# Patient Record
Sex: Female | Born: 1940 | Race: White
Health system: Northeastern US, Academic
[De-identification: ages and names within clinical notes are randomized; demographics above are authoritative.]

## PROBLEM LIST (undated history)

## (undated) DIAGNOSIS — M779 Enthesopathy, unspecified: Secondary | ICD-10-CM

## (undated) DIAGNOSIS — S83209A Unspecified tear of unspecified meniscus, current injury, unspecified knee, initial encounter: Secondary | ICD-10-CM

## (undated) DIAGNOSIS — C4491 Basal cell carcinoma of skin, unspecified: Secondary | ICD-10-CM

## (undated) DIAGNOSIS — M7542 Impingement syndrome of left shoulder: Secondary | ICD-10-CM

## (undated) DIAGNOSIS — I341 Nonrheumatic mitral (valve) prolapse: Secondary | ICD-10-CM

## (undated) DIAGNOSIS — S63056A Dislocation of other carpometacarpal joint of unspecified hand, initial encounter: Secondary | ICD-10-CM

## (undated) DIAGNOSIS — M719 Bursopathy, unspecified: Secondary | ICD-10-CM

## (undated) HISTORY — DX: Nonrheumatic mitral (valve) prolapse: I34.1

## (undated) HISTORY — DX: Bursopathy, unspecified: M71.9

## (undated) HISTORY — DX: Enthesopathy, unspecified: M77.9

## (undated) HISTORY — DX: Basal cell carcinoma of skin, unspecified: C44.91

## (undated) HISTORY — DX: Unspecified tear of unspecified meniscus, current injury, unspecified knee, initial encounter: S83.209A

## (undated) HISTORY — DX: Dislocation of other carpometacarpal joint of unspecified hand, initial encounter: S63.056A

## (undated) HISTORY — DX: Impingement syndrome of left shoulder: M75.42

---

## 2013-12-29 IMAGING — NM HEPATOBILIARY SCAN WITH SINCALIDE
10 series · 15 of 15 positions shown · non-contrast
Comparison: none

HEPATOBILIARY SCAN WITH CCK, 12/29/2013 [DATE]:
HISTORY: Postprandial pain, bloating
TECHNIQUE: The patient was injected with 8.2 mCi of technetium 99m Choletec.
Imaging of the right upper quadrant were then performed.  1.2 mcg of sincalide
was injected intravenously and a stimulated gallbladder ejection fraction was
performed.

[5 min · 2.26mm/px · 1 of 1 slices shown]
[im 1/1]
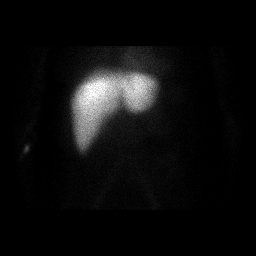

[10 min · 2.26mm/px · 1 of 1 slices shown]
[im 1/1]
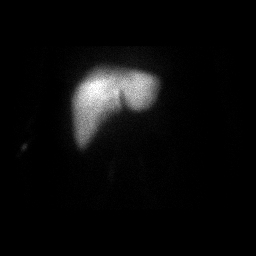

[20 min · 2.26mm/px · 1 of 1 slices shown]
[im 1/1]
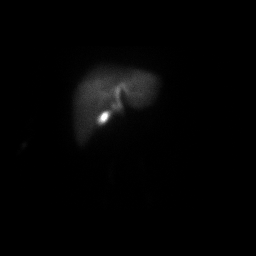

[30 min · 2.26mm/px · 1 of 1 slices shown]
[im 1/1]
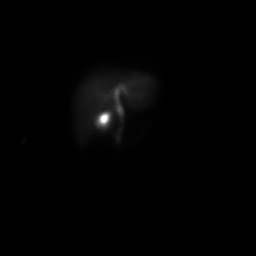

[rao · 2.26mm/px · 1 of 1 slices shown]
[im 1/1]
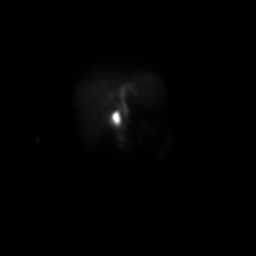

[lao · 2.26mm/px · 1 of 1 slices shown]
[im 1/1]
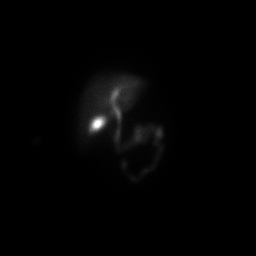

[40 min · 2.26mm/px · 1 of 1 slices shown]
[im 1/1]
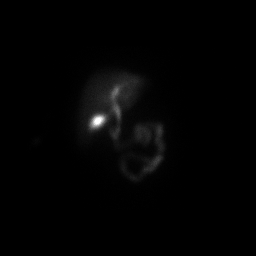

[50 min · 2.26mm/px · 1 of 1 slices shown]
[im 1/1]
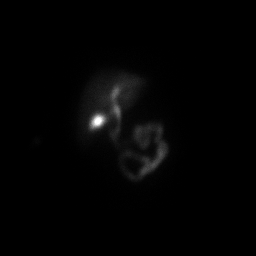

[60 min · 2.26mm/px · 1 of 1 slices shown]
[im 1/1]
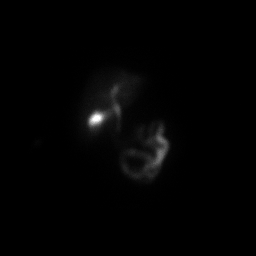

[gb ef · 4.52mm/px · 6 of 34 frames shown]
[frame 3/34]
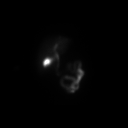
[frame 9/34]
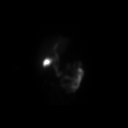
[frame 15/34]
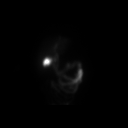
[frame 20/34]
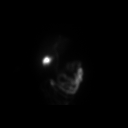
[frame 26/34]
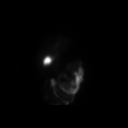
[frame 32/34]
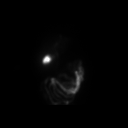

[15 of 15 positions shown; findings below may reference images not displayed]

FINDINGS: There is normal uptake and clearance of radiopharmaceutical by the
liver. The gallbladder is well visualized at 20 minutes, and common bile duct
and small bowel are well visualized by 30-35 minutes. Gallbladder ejection
fraction at 10 minutes is 0, 20 minutes 5%, 30 minutes 3%.
IMPRESSION: The cystic and common bile ducts are open. There is marked decrease in
gallbladder ejection fraction, which is compatible with gallbladder
dyskinesia,
possibly chronic cholecystitis.

## 2015-08-31 ENCOUNTER — Telehealth: Payer: Self-pay

## 2015-08-31 NOTE — Telephone Encounter (Signed)
Called patient and spoke with husband told him I needed to verify her insurance and he was on the other line with her so he would give her my number so she could call me and give me her insurance information.

## 2015-09-07 ENCOUNTER — Ambulatory Visit: Payer: Self-pay | Admitting: Dermatology

## 2015-09-07 ENCOUNTER — Encounter: Payer: Self-pay | Admitting: Dermatology

## 2015-09-07 VITALS — BP 126/76 | HR 89 | Ht 60.0 in | Wt 138.0 lb

## 2015-09-07 DIAGNOSIS — C44310 Basal cell carcinoma of skin of unspecified parts of face: Secondary | ICD-10-CM

## 2015-09-07 NOTE — Patient Instructions (Signed)
Full Thickness Skin Graft    A full thickness skin graft is a thin layer of skin placed over the surgical wound and stitched into place.  The new skin depends on the blood supply from the wound surface and the skin edges around it.  To help the new skin take it is protected with a yellow medicated gauze that is tied on over the graft and then it is covered with a pressure dressing.  The donor site, where the skin was borrowed from, will also have stitches and will be covered with a pressure dressing.    WOUND CARE:  The pressure dressing over the graft, nose must be kept on and dry until Saturday and then remove bandage and clean daily. Apply aquaphor and cover with telfa and tape or a band aid.    The dressing over the donor site, in front of ear needs to stay on and dry until Saturday. This site then needs to be cleaned daily with soap and water.  Pat Dry.  Apply a thin layer of Vaseline/aquaphor ointment over the stitches.  (If you have steri-strips over the site do not use any ointment) Cover the wound with a piece of Telfa non-stick dressing held in place with a band-aid or tape.    PAIN:  Postoperative pain is usually minimal.  Tylenol or Ibuprofen should relieve any pain you may have.  For the first day or two you may also use an icepack for twenty minutes every two to three hours.  For severe pain  not relieved with Tylenol/Ibuprofen please call the office.      BLEEDING:  Careful attention has been given to your wound to prevent bleeding.  The dressings you have on are pressure dressings to help prevent bleeding.    A small amount of blood on the dressing is normal.  If the bleeding seems persistent and saturates either dressing, apply firm, steady pressure over the dressing with fresh gauze for fifteen minutes.  Tape the gauze in place.  This is usually adequate treatment.  If the bleeding does not stop call our office.    Relax and recline the first day or two.  No bending/heavy lifting for one week.   Chew softer foods on your right side.       QUESTIONS/CONCERNS: If you have any questions or concerns please dont hesitate to call out office at [phone].  You may call the same number for after hour or weekend emergencies (severe pain, excessive bleeding/swelling) and the Dermatology resident on call will contact you.                                         WOUND CARE ONE WEEK FOLLOWING SURGERY      The bandages and sutures will be removed in the office.  The graft is normally dark pink/purple and crusty around the edges.  Usually within a month the graft lightens and starts to become flesh colored.     The graft should be cleaned daily with soap and water.  Pat dry. Apply a layer of Vaseline/aquaphor ointment around the edges and cover with a piece of Telfa non-stick dressing held in place with a band aid or tape.    The donor site will be covered with steri- strips.  Steri-strips are like little pieces of tape that reinforce the suture line.  Wash over the steri-strips with  soap and water and pat dry.  Do not remove the steri-strips , let them fall off on their own.

## 2015-09-08 ENCOUNTER — Telehealth: Payer: Self-pay

## 2015-09-08 NOTE — Telephone Encounter (Signed)
Called pt for 24 hour surgical post-op follow-up.      Bleeding: none    Discomfort: +pain, tylenol with good relief    Concerns: none    Pt verbalized understanding: yes    Follow-up: S/R

## 2015-09-08 NOTE — Progress Notes (Signed)
PREOPERATIVE DIAGNOSIS: Basal cell carcinoma  POSTOPERATIVE DIAGNOSIS: Same   OPERATION   Mohs Surgery    Indications:  The patient was referred by Dr. Suella Broad and has a biopsy proven basal cell carcinoma on the left ala measuring 5 x 4 mm.  Due to its location, Mohs surgery is indicated.  The Mohs surgical procedure was explained including other therapeutic options, and the inherent risks of bleeding, scar formation, reaction to local anesthesia, cosmetic deformity, recurrence, infection, and nerve damage.  Informed consent was obtained and the pt. underwent fresh tissue Mohs surgery as follows.     STAGE I: The site of the skin cancer was identified concurrently by both the patient and Dr. Owens Shark and marked with a surgical pen; the margins of the excision were delineated with the marking pen.  The patient was placed on the operating table.  The wound was defined and infiltrated with 1% Lidocaine with epinephrine 1:100,000.  All gross tumor was completely excised in a debulking stage using aggressive curettage and/or cold steel.  With all visible gross tumor completely excised, an excision was made around the debulking defect.  Hemostasis was obtained by spot electrodesiccation.  A pressure dressing was placed.  Tissue was divided into two tissue blocks which were mapped, color coded at their margins, and sent to the technician for frozen sectioning.  Microscopic tumor was found persisting in none of the tissue blocks.  Following surgery the defect measured as follows: 10 x 7 mm to the subcutaneous tissue and dermis.  Closure will be by a full thickness skin graft.    CONDITION AT TERMINATION OF THERAPY: Carcinoma removed.    Complications:  None    PREOPERATIVE DIAGNOSIS:  Defect following Mohs micrographic surgery for a basal cell carcinoma  POSTOPERATIVE DIAGNOSIS: Basal cell carcinoma  OPERATION: Full thickness skin graft    INDICATIONS: The patient was left with a defect measuring 10 x 7 mm located on the  left ala following Mohs micrographic surgery for removal of a basal cell carcinoma.  Various closure modalities were discussed with the patient and it was decided that a Full thickness skin graft would best preserve normal anatomical and functional relationships.  The procedure was explained, including the risks of bleeding scarring, infection, wound dehiscence, numbness or other nerve damage, flap/graft necrosis and reaction to local anesthesia, informed consent was obtained and the patient underwent the procedure as follows.    Full thickness skin graft    Procedure:  The patient was taken to the operative suite and positioned comfortably on the operating room table.  1% Lidocaine with Epinephrine was used for local anesthesia at both the defect site as well as the donor site on the left preauricular area.  The areas were washed with Hibiclens rinsed with saline and draped with sterile towels. A template of the defect was taken using a non-stick Telfa and was transferred to the donor site.  The skin graft was sharply excised along the template to the level of the dermal/subcutaneous interface.  The graft was placed in sterile saline.  The donor defect was undermined in a subcutaneous plane.  Bleeding was controlled with spot electrodesiccation.  Deep closure was achieved with 5-0 Monocryl sutures.  Excess cones of tissue were removed with the triangulation technique.  The epidermis was closed with 6-0 Prolene sutures.  The final wound size was 2 cm.  The graft was aggressively defatted.  It was trimmed to fit exactly into the defect and then was secured around  the periphery with 6-0 Prolene sutures.  Final graft size: 1 cm squared.   Sterile pressure dressings were applied at both sites.  Wound care instructions were given.  The patient was discharged ambulatory and with stable vital signs.  RV in one week.    Final Procedure: Full thickness skin graft.     Complications:  None.      Learning needs assessed.    Satira Anis, am scribing for Dr. Delynn Flavin.

## 2015-09-14 ENCOUNTER — Encounter: Payer: Self-pay | Admitting: Dermatology

## 2015-09-14 ENCOUNTER — Ambulatory Visit: Payer: Self-pay | Admitting: Dermatology

## 2015-09-14 VITALS — BP 130/67 | HR 84 | Ht 60.0 in | Wt 138.0 lb

## 2015-09-14 DIAGNOSIS — Z4802 Encounter for removal of sutures: Secondary | ICD-10-CM

## 2015-09-14 NOTE — Progress Notes (Signed)
Post-op Check    No pain or bleeding. Surgical site L ala  healing well.  No infection, hematoma or dehiscence.  There is good take of the graft with pink/purple color.  Donor site is healing well.    Sutures removed, steristrips applied.  Wound care instructions reinforced.    Follow-up in 3-4 weeks unless patient pleased with healing at that time.  Long-term follow-up with primary dermatologist for skin checks.

## 2015-09-14 NOTE — Patient Instructions (Signed)
WOUND CARE ONE WEEK FOLLOWING SURGERY      The bandages and sutures will be removed in the office.  The graft is normally dark pink/purple and crusty around the edges.  Usually within a month the graft lightens and starts to become flesh colored.     The graft should be cleaned daily with soap and water.  Pat dry. Apply a layer of Vaseline/aquaphor ointment around the edges and cover with a piece of Telfa non-stick dressing held in place with a band aid or tape.    The donor site will be covered with steri- strips.  Steri-strips are like little pieces of tape that reinforce the suture line.  Wash over the steri-strips with soap and water and pat dry.  Do not remove the steri-strips , let them fall off on their own.

## 2020-01-20 IMAGING — DX HIP 2 VIEWS RIGHT WITH PELVIS
1 series · 3 of 3 positions shown · non-contrast
Comparison: 11/20/2016 CT

HIP 2 VIEWS RIGHT WITH PELVIS, 01/20/2020 [DATE]: 
CLINICAL INDICATION:  Chronic right hip pain.

[Series 1: AP · U · 0.14mm/px · 3 of 3 slices shown]
[im 1/3]
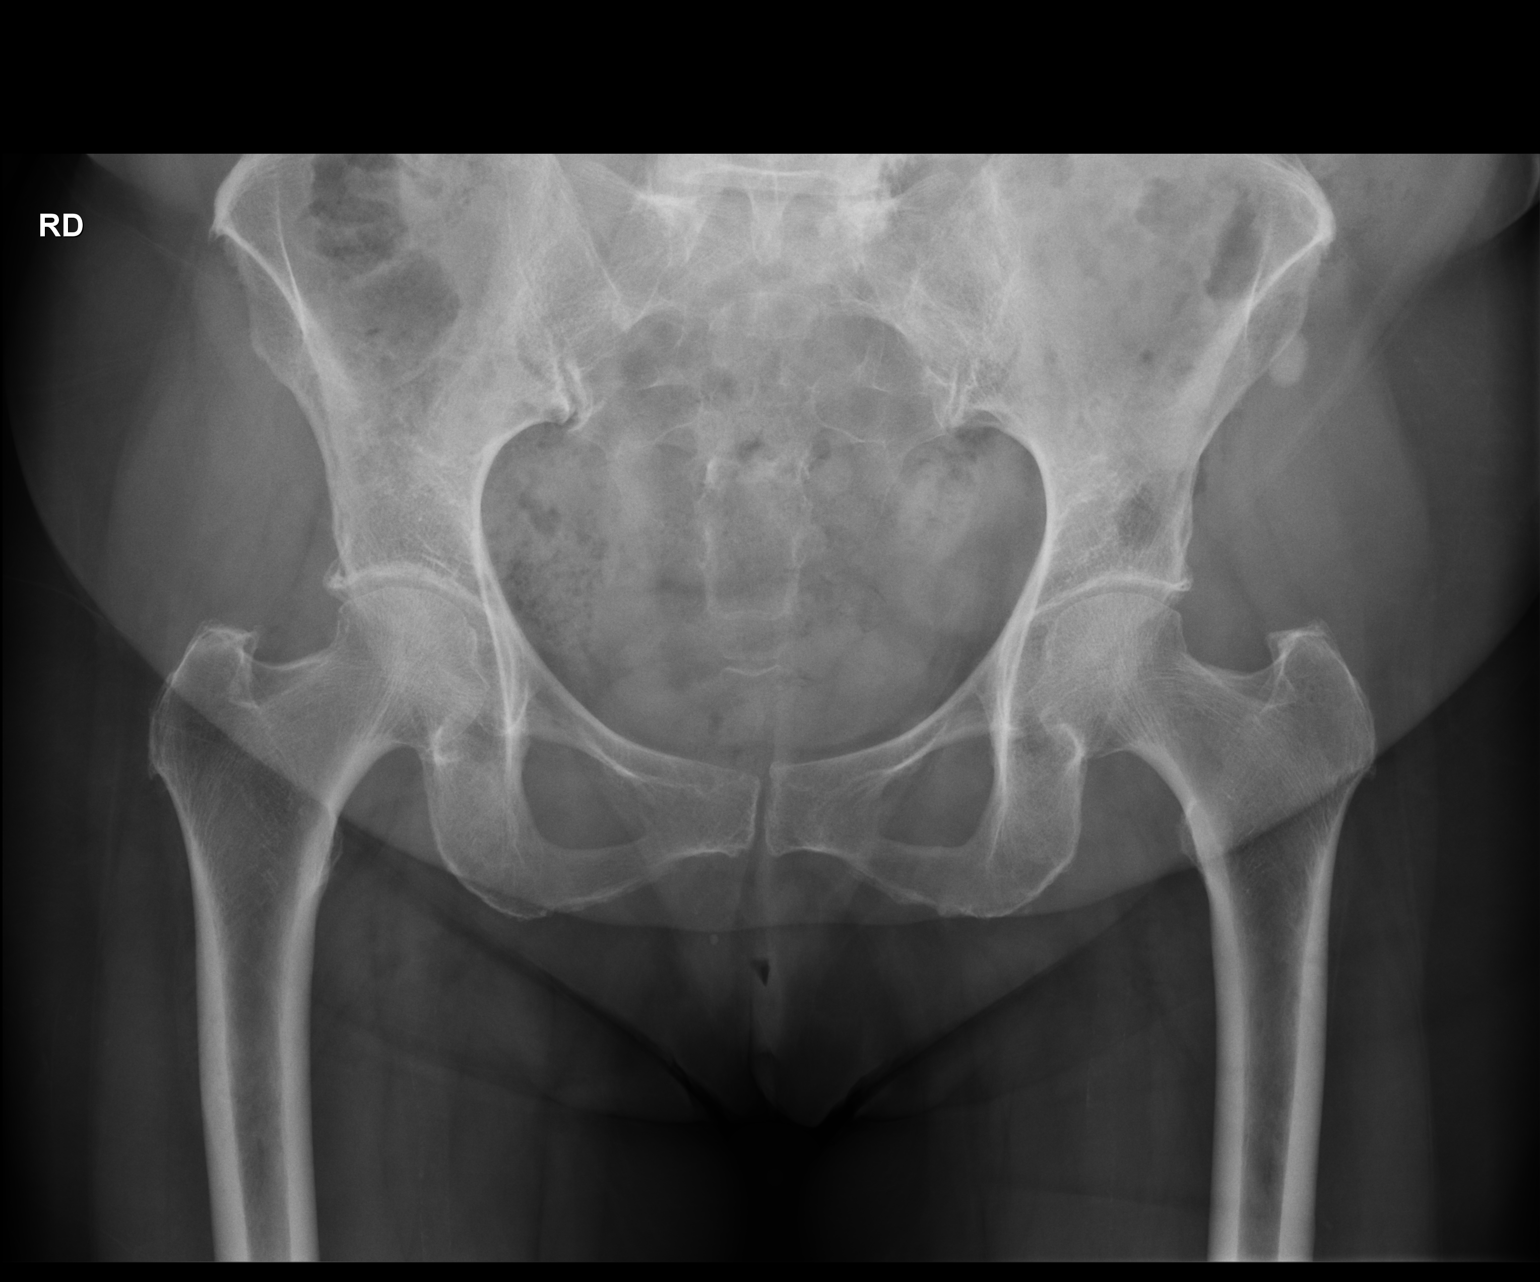
[im 2/3]
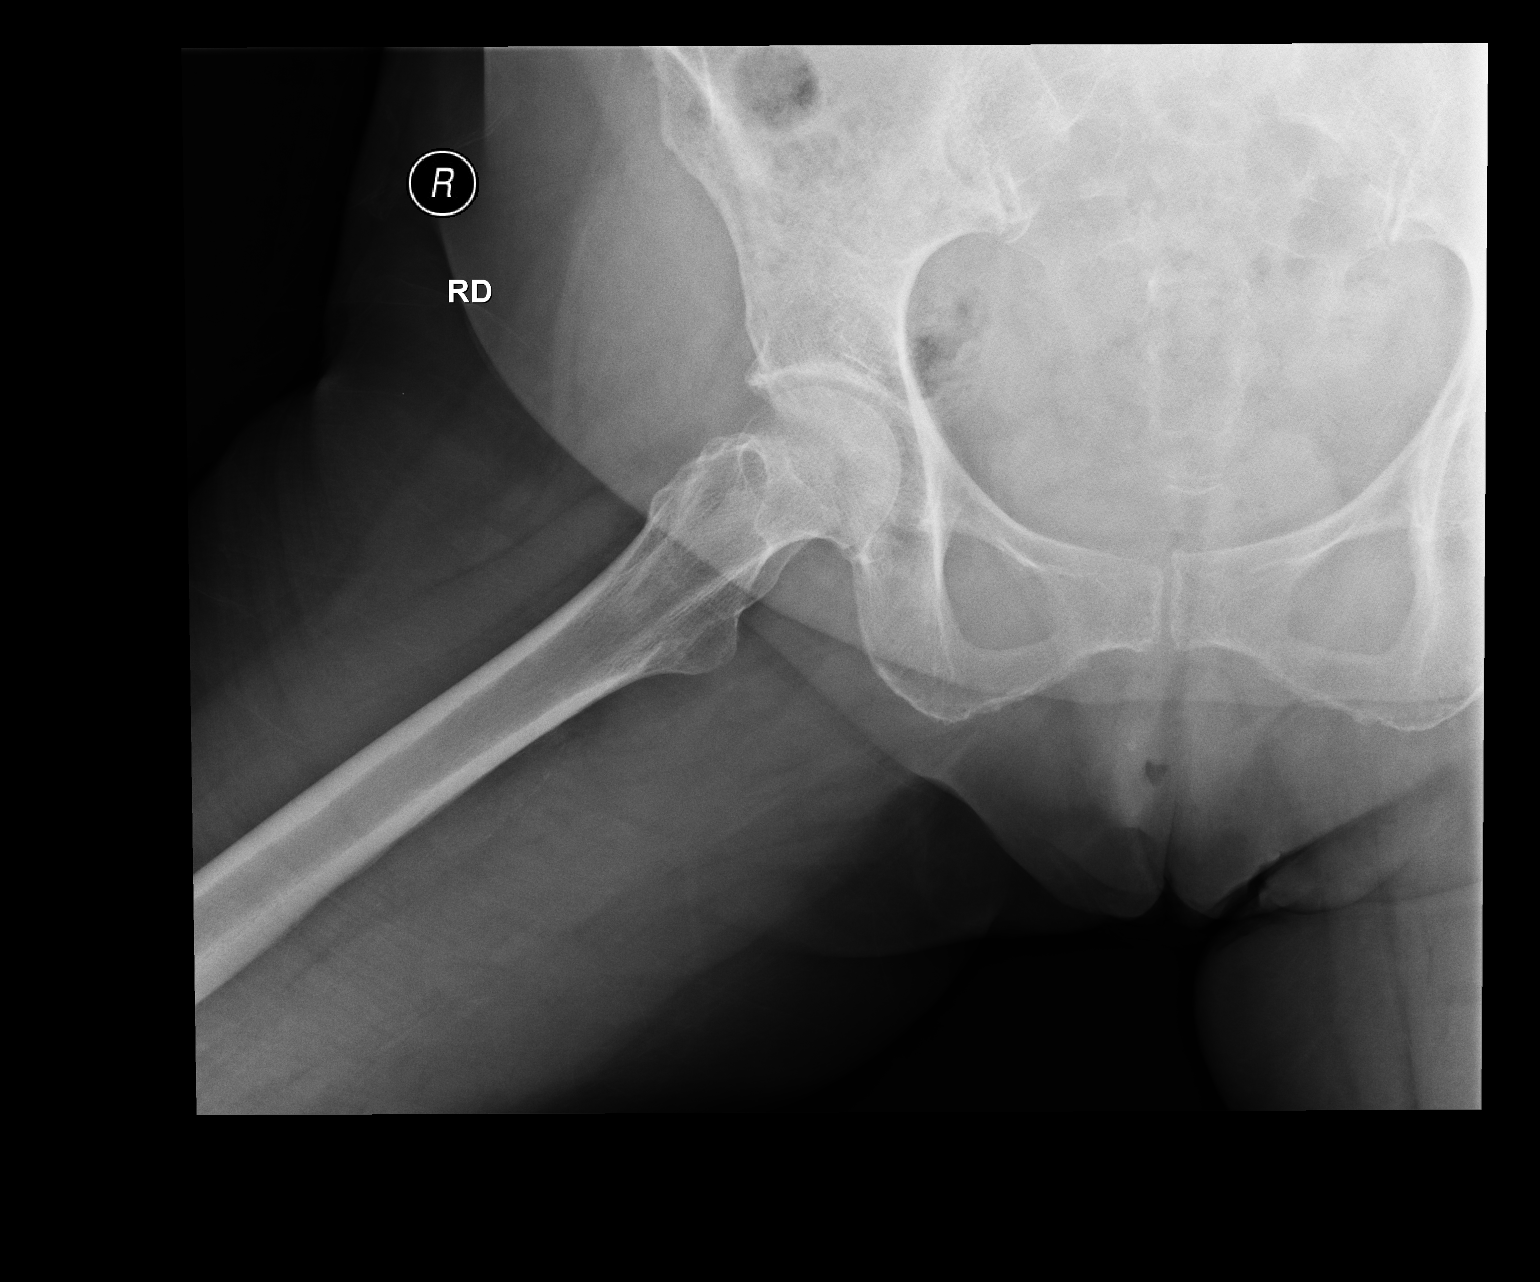
[im 3/3]
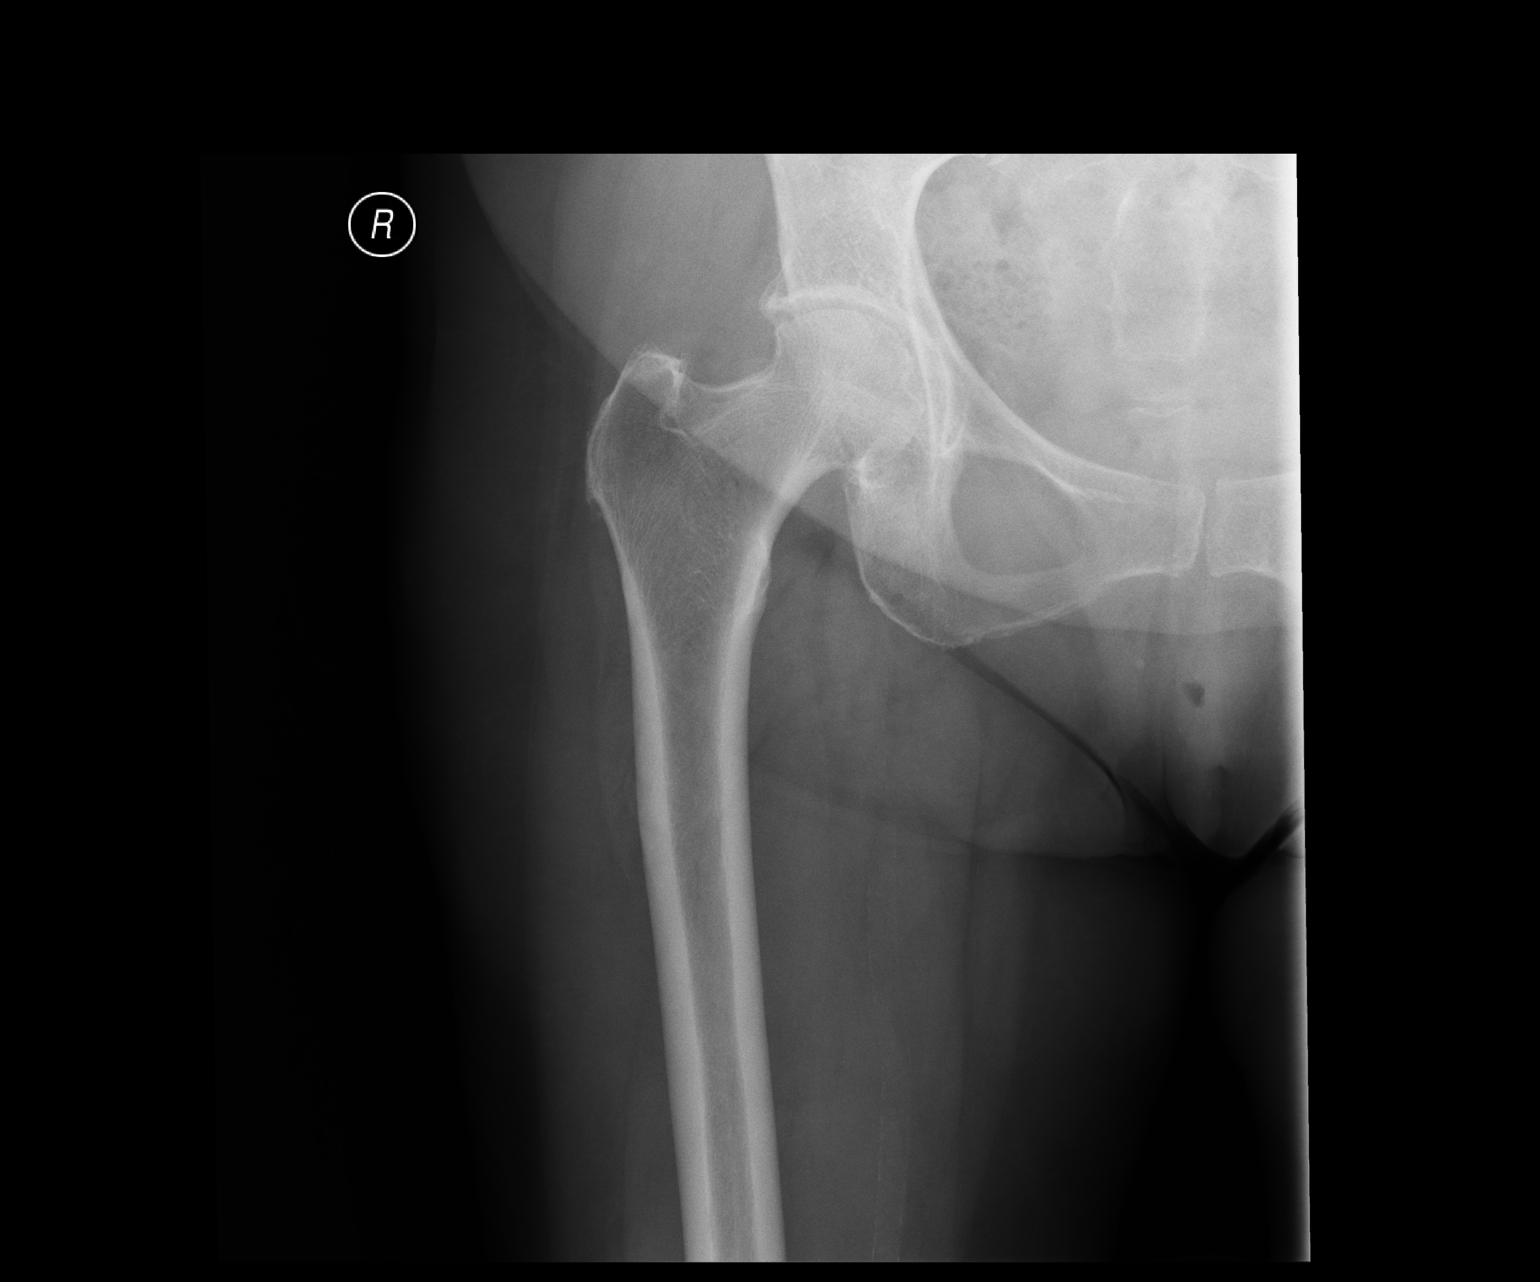

[3 of 3 positions shown; findings below may reference images not displayed]

FINDINGS: Mild degenerative change of the hips and pubic symphysis. Degenerative 
change of the SI joints and spine. The femoral heads are well located. No 
fracture. Normal alignment. Osteopenia. Soft tissues are negative.
IMPRESSION: 1. Mild degenerative change of the hips and pubic symphysis. 
2. Degenerative change of the SI joints and spine. 
3. Osteopenia: DXA may be helpful for further evaluation.

## 2020-02-06 IMAGING — MR MRI LUMBAR SPINE WITHOUT CONTRAST
4 of 8 series · 16 of 48 positions shown · non-contrast
Comparison: None

MRI LUMBAR SPINE WITHOUT CONTRAST, 02/06/2020 [DATE]: 
CLINICAL INDICATION: Low back pain, right groin pain for one year, sterile 
bronchus 6 months ago with increasing low back pain, no trauma, no prior lumbar 
surgery
TECHNIQUE: Multiplanar multisequence MR of the lumbar spine without contrast.

[Series 101: survey · axial · 10.0mm · 1.39mm/px · z∈[-33,+201]mm · 2 of 10 slices shown]
[im 1/10]
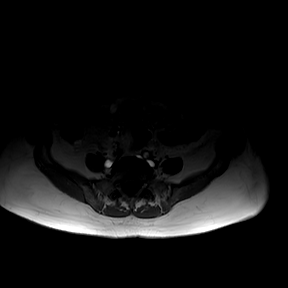
[im 10/10]
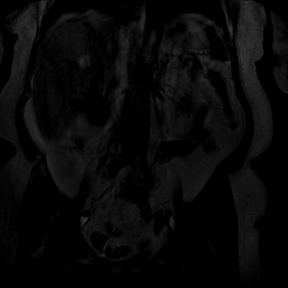

[Series 201: t2w_cor-surv · coronal · 6.0mm · 0.58mm/px · 1 of 7 slices shown]
[im 1/7]
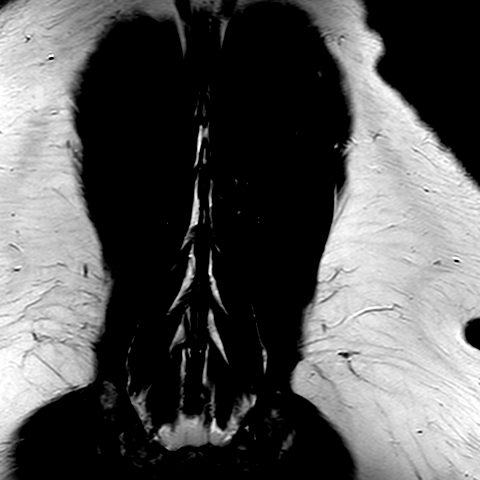

[Series 301: t2_tse_sag · sagittal · 4.0mm · 0.41mm/px · 5 of 17 slices shown]
[im 1/17]
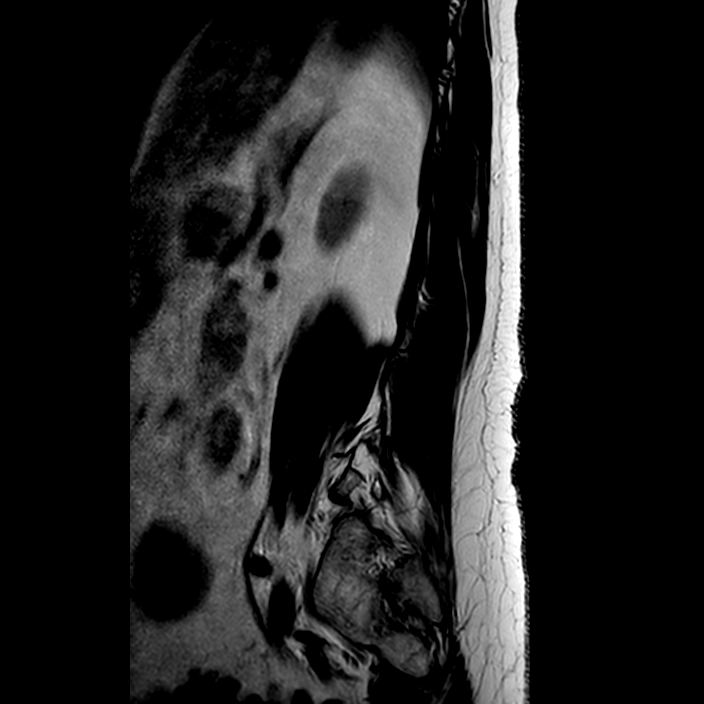
[im 5/17]
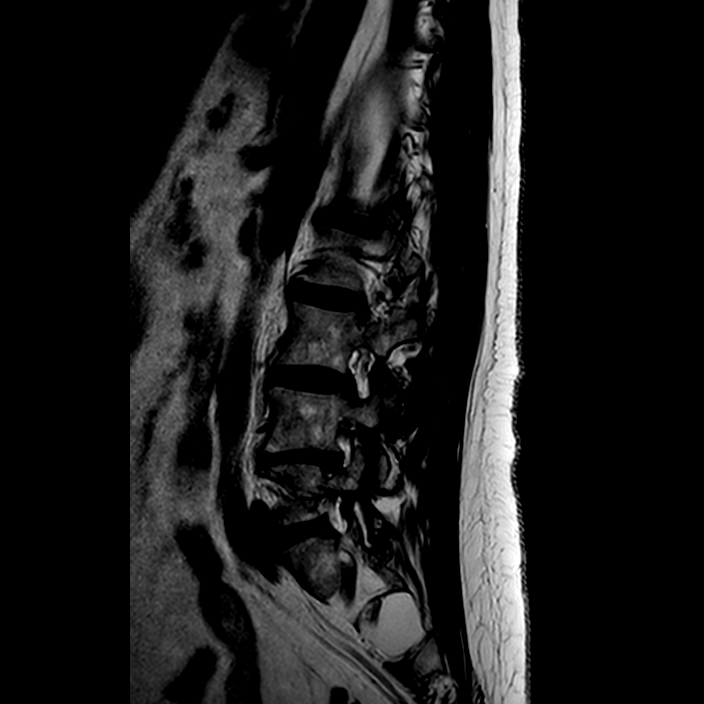
[im 9/17]
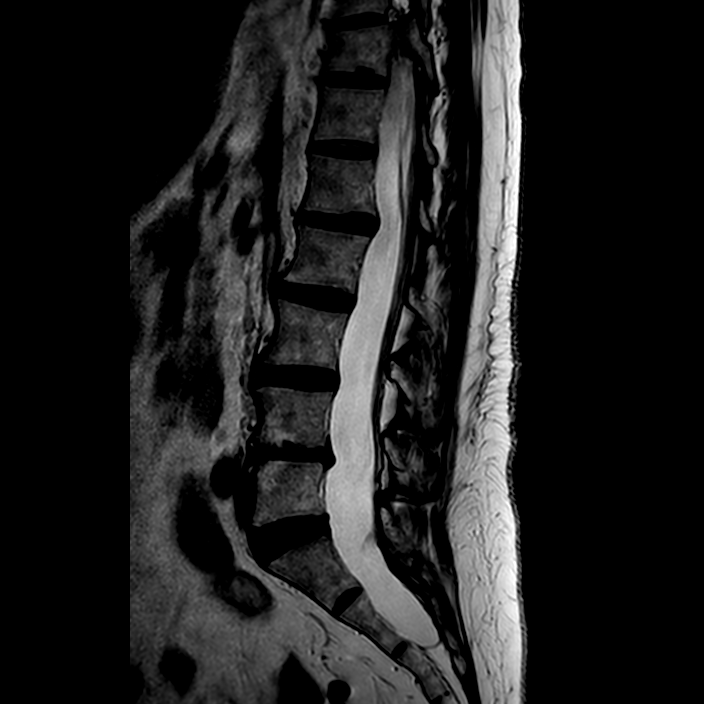
[im 13/17]
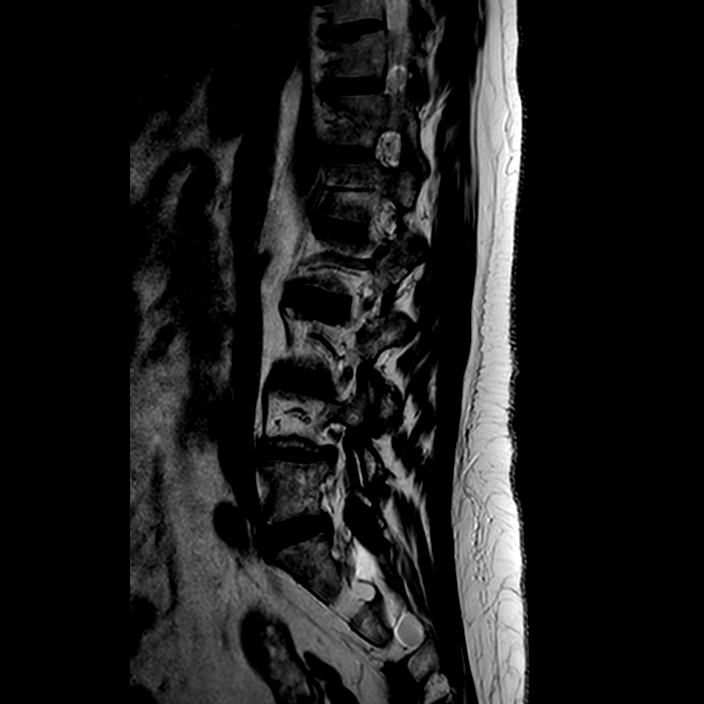
[im 17/17]
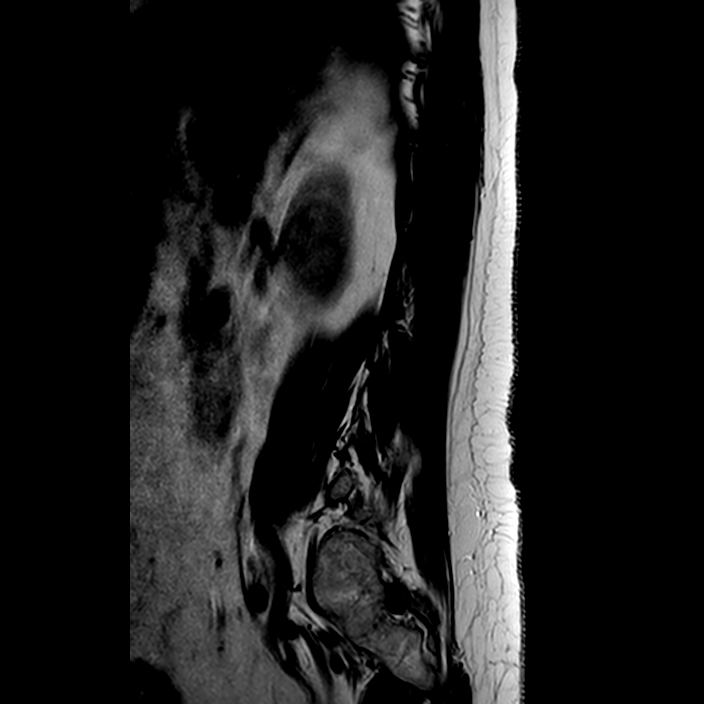

[Series 701: T1 · axial · 4.0mm · 0.38mm/px · z∈[-54,+118]mm · 8 of 35 slices shown]
[im 1/35]
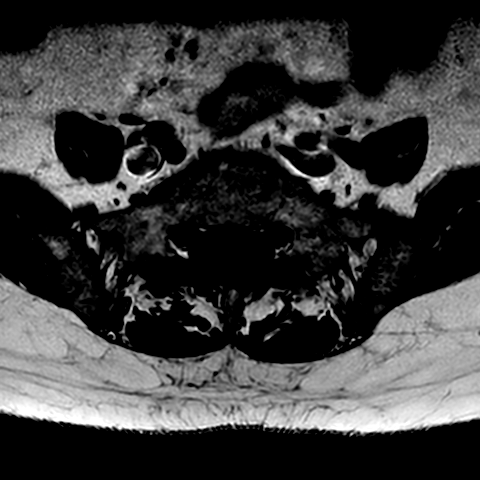
[im 4/35]
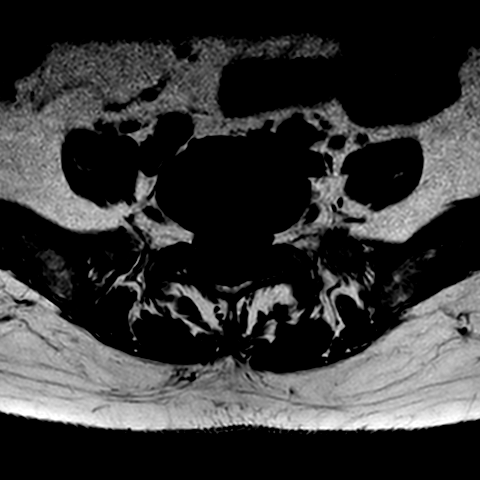
[im 12/35]
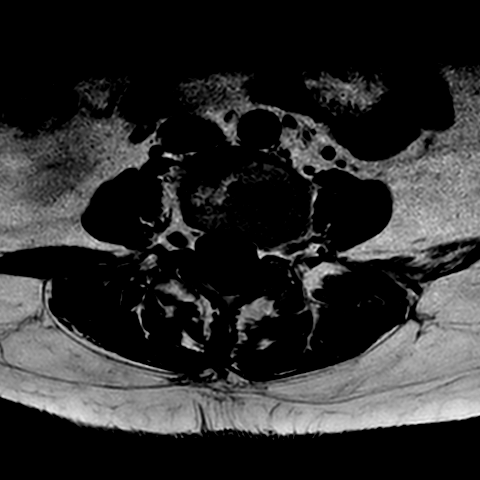
[im 16/35]
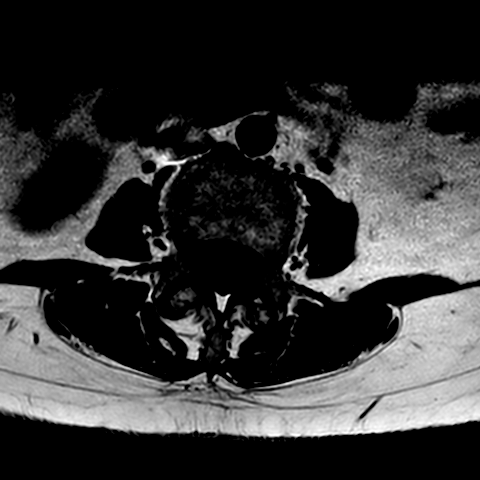
[im 19/35]
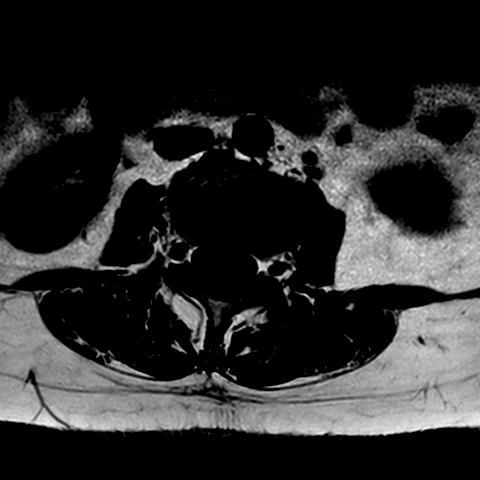
[im 23/35]
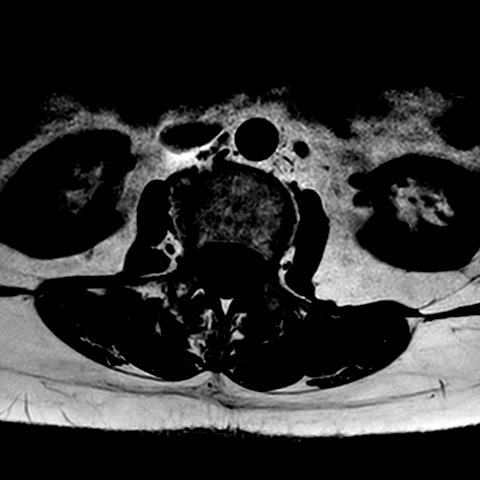
[im 31/35]
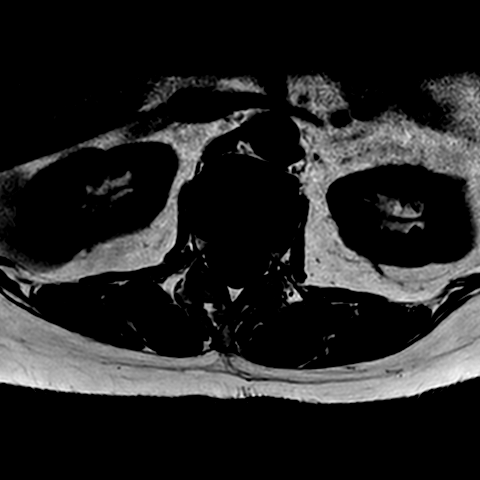
[im 35/35]
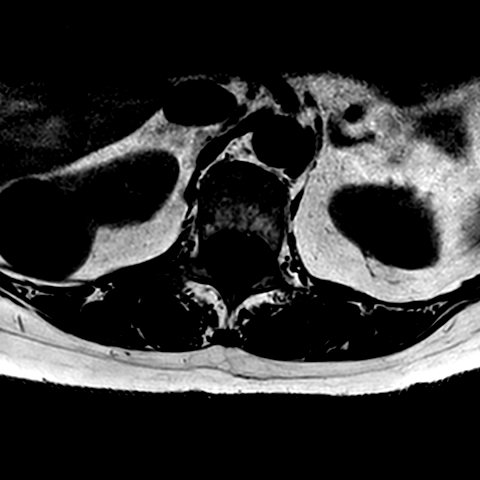

[16 of 48 positions shown; findings below may reference images not displayed]

FINDINGS: There is mildly heterogeneous marrow signal with opposing type degenerative 
endplate changes at L4-L5. There is Schmorl's nodes at T11, L1, and L5. 
Intraosseous hemangioma within the L1 vertebral body. No suspicious discrete 
marrow lesion. There is no abnormal STIR signal. 
Reversal the normal lumbar lordosis centered at L1-L2. Mild straightening of the 
lordosis. There is no true listhesis. There is a leftward curvature on coronal 
images centered at L2-L3. There is subluxation of L3 over L4 measuring 6 mm 
leftward. 
The conus terminates at the L1-L2 disc space level. Signal within the distal 
cord and conus is normal. 
Visualized retroperitoneal structures demonstrate a right exophytic cystic 
lesion arising from the right kidney. Otherwise no significant or acute 
findings. 
T12-L1: No significant disc abnormality, spinal canal, or foraminal narrowing. 
L1-L2: Diffuse disc bulge as well as mild facet degenerative changes are 
present. There is no significant spinal canal or foraminal narrowing however. 
L2-L3: Mild facet degenerative changes. No significant disc abnormality, spinal 
canal, or foraminal narrowing. 
L3-L4: Mild diffuse disc bulge as well as moderate facet degenerative changes 
are present. There is no significant spinal canal narrowing. There is mild 
bilateral foraminal narrowing. 
L4-L5: There is loss of disc space height. There is a superiorly extending 
slightly right asymmetric extrusion. This minimally indents the ventral aspect 
of thecal sac without significant spinal canal narrowing. There is moderate 
facet degenerative changes at this level. There is moderate to high-grade left 
and mild right foraminal narrowing at this level. 
L5-S1: No significant disc abnormality, spinal canal narrowing. There is mild 
bilateral foraminal narrowing. Moderate facet degenerative changes. 
Dilated nerve root sheaths are left of midline at the level of the exiting S to 
nerve root. Finding is of doubtful clinical significance.
IMPRESSION: 1.  Mild to moderate multilevel lumbar spondylosis as above. 
2.  L4-L5 disc space level appears most pronounced with superiorly extending 
extrusion. There is no significant spinal canal narrowing at this level. There 
is moderate to high-grade left foraminal narrowing. 
3.  Lesser degrees of stenosis elsewhere. 
4.  Multilevel moderate facet degenerative changes. 
5.  Straightening of the normal lumbar lordosis. 
6.  Dilated nerve root sheaths at the level of the left S2 nerve root and the 
sacrum. These findings are benign and usually of no clinical significance. 
7.  Exophytic right renal cystic lesion.

## 2020-10-27 IMAGING — DX CHEST PA AND LATERAL
1 series · 2 of 2 positions shown · non-contrast
Comparison: Chest from 02/25/2018

CLINICAL INDICATION:  History of asthma with cough

[Series 1: PA · U · 0.14mm/px · 2 of 2 slices shown]
[im 1/2]
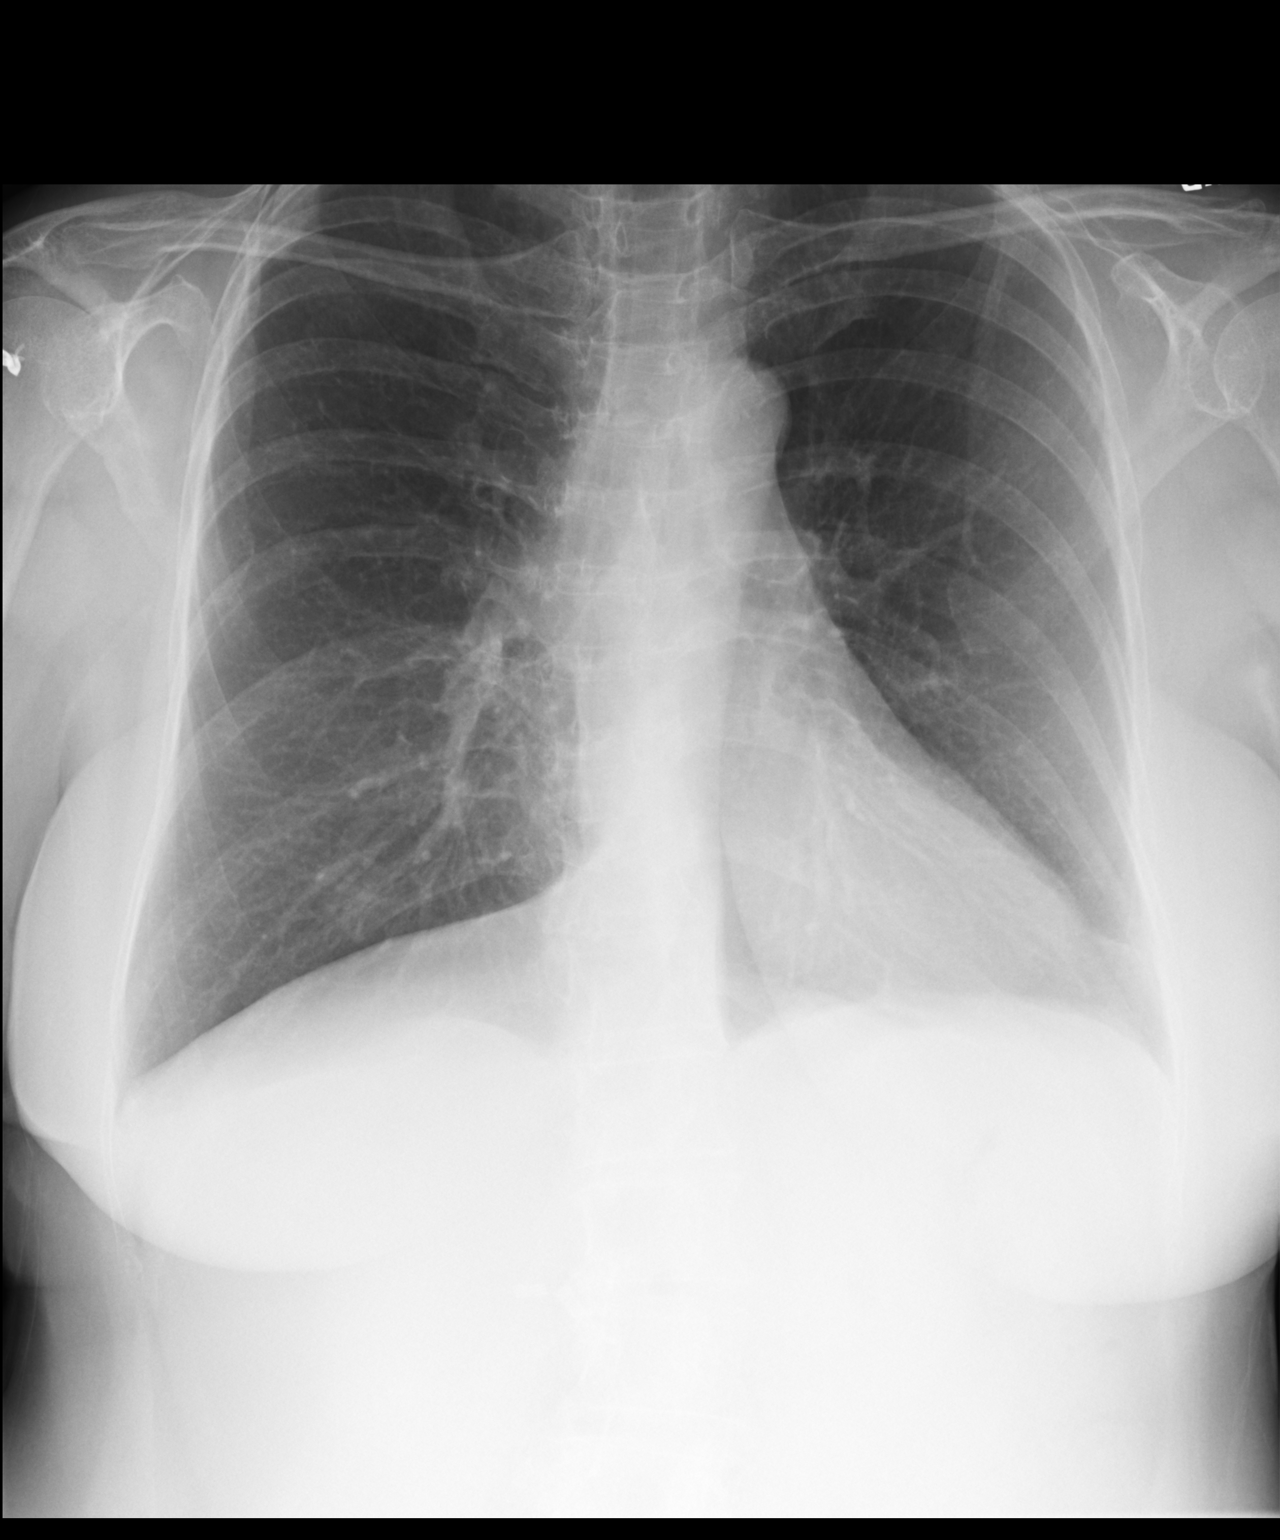
[im 2/2]
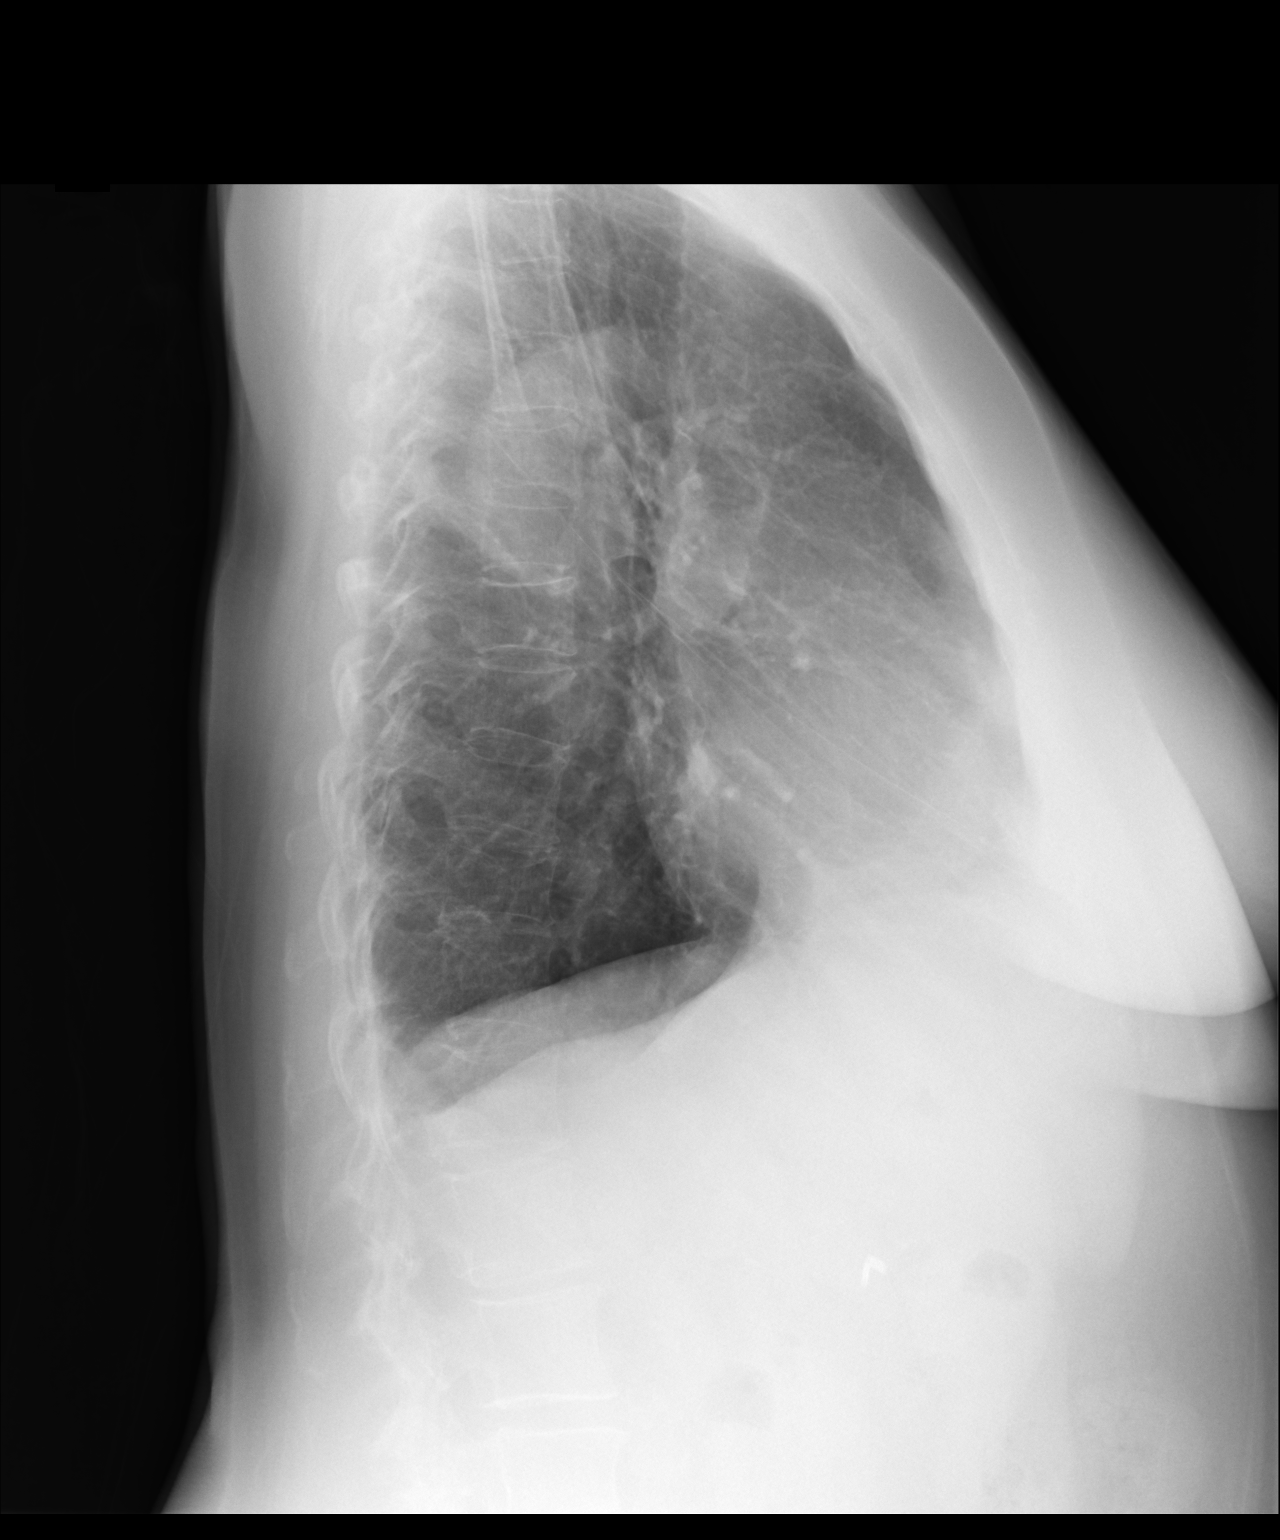

[2 of 2 positions shown; findings below may reference images not displayed]

FINDINGS: Mild hyperinflation of the lungs without evidence for consolidation 
or pleural effusion. Normal heart size and pulmonary vascularity. Suture anchors 
right humeral head. Surgical clips right upper quadrant.
IMPRESSION: No acute cardiopulmonary findings.

## 2020-11-25 IMAGING — CT CT ABDOMEN AND PELVIS WITH CONTRAST
2 of 3 series · 16 of 46 positions shown, 18 images · IV contrast (ISOVUE 300)
Comparison: Comparison was made to the prior exam(s) within the last 12 months 
dated   11/20/2016 .

CT ABDOMEN AND PELVIS WITH CONTRAST, 11/25/2020 [DATE]: 
A search for DICOM formatted images was conducted for prior CT imaging studies 
completed at a non-affiliated media free facility. 
CLINICAL INDICATION: Ventral hernia without obstruction. Lump at site of 
previous hernia repair
TECHNIQUE: The abdomen and pelvis was scanned from lung bases through the pubic 
rami with 100 mL of Isovue 300 on a high-resolution CT scanner using dose 
reduction techniques.  Routine MPR reconstructions were performed. The patient's 
eGFR was calculated to be 81 using the i-STAT device.

[Series 4: abd/pel ax w · axial · 0.74mm/px · z∈[-458,-80]mm · 13 of 146 slices shown, 15 images]
[im 10/146  soft-tissue]
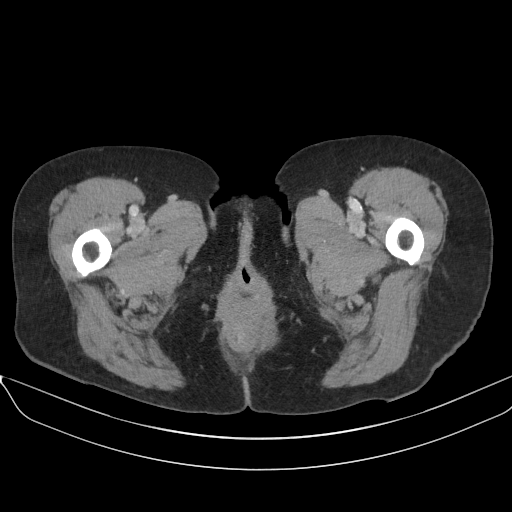
[im 10/146  bone]
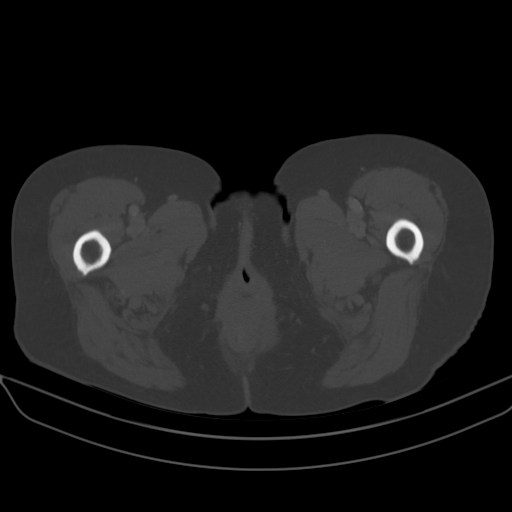
[im 19/146  soft-tissue]
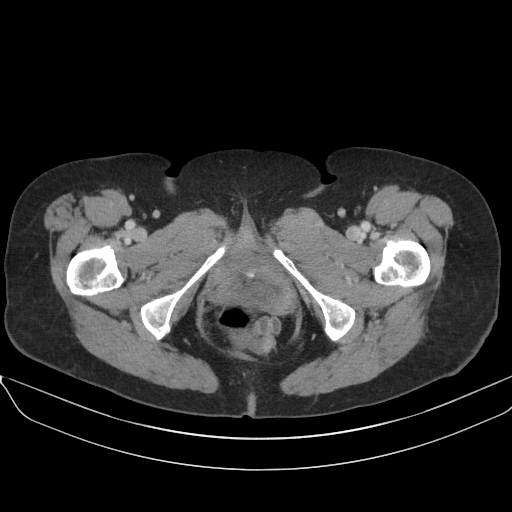
[im 29/146  soft-tissue]
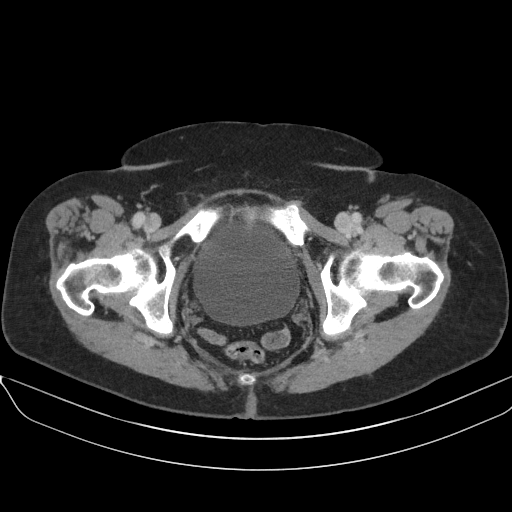
[im 43/146  soft-tissue]
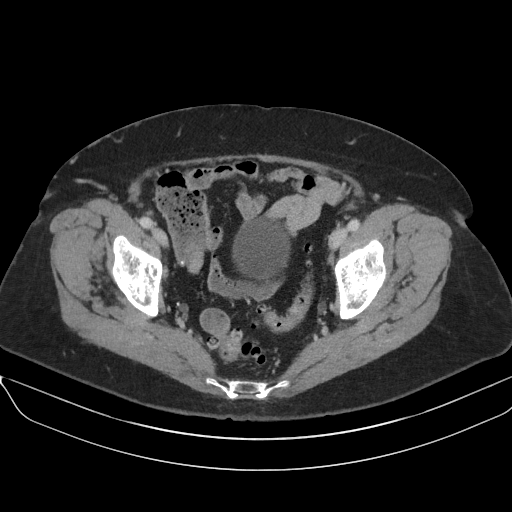
[im 52/146  soft-tissue]
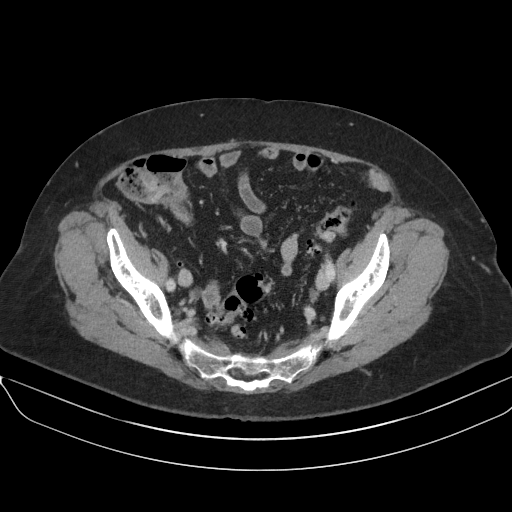
[im 61/146  soft-tissue]
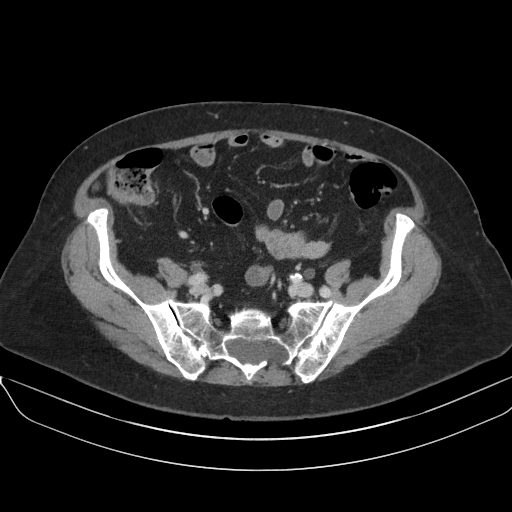
[im 75/146  soft-tissue]
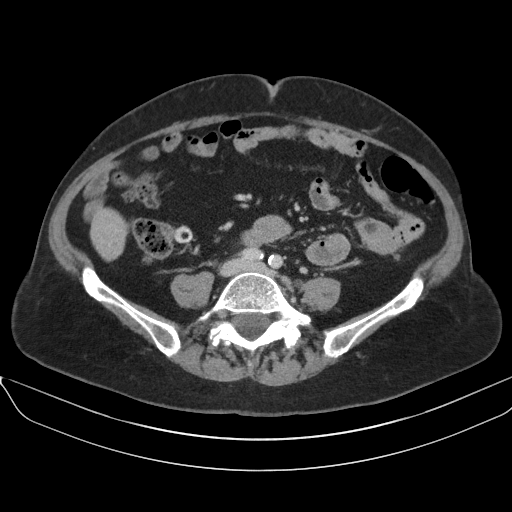
[im 85/146  soft-tissue]
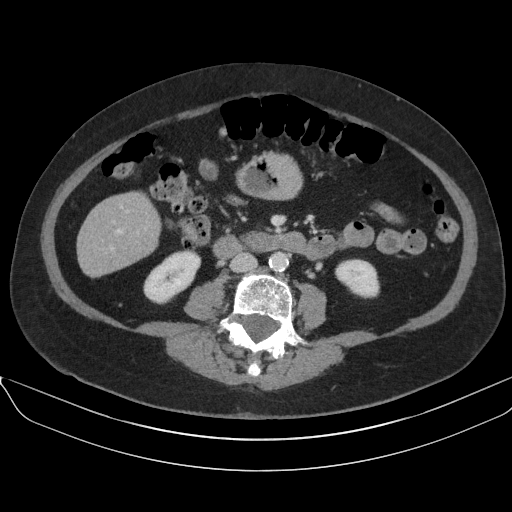
[im 94/146  soft-tissue]
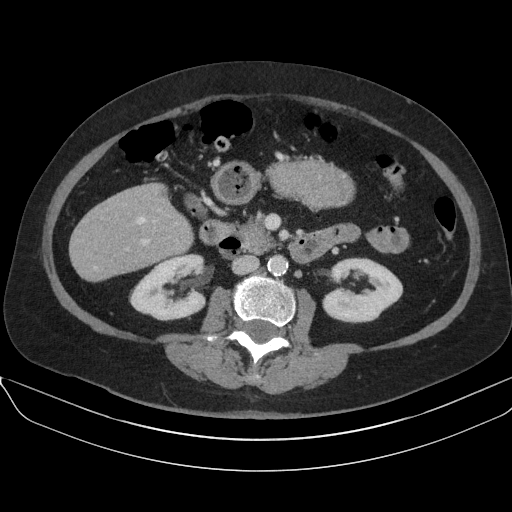
[im 94/146  bone]
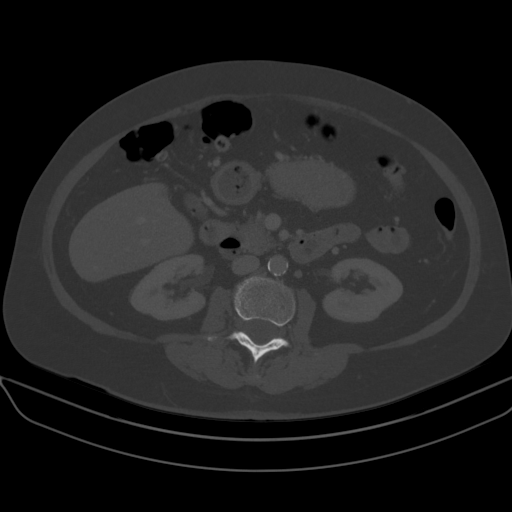
[im 103/146  soft-tissue]
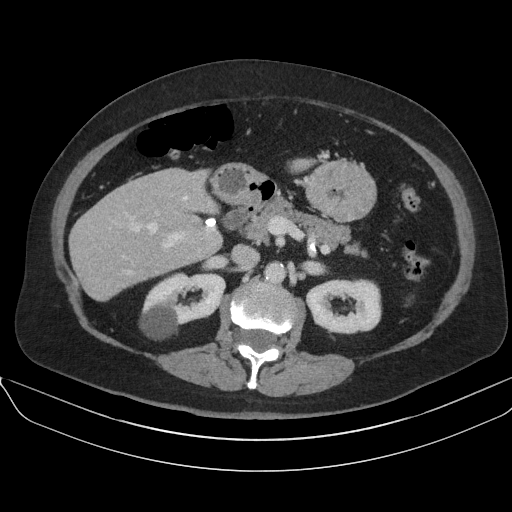
[im 117/146  soft-tissue]
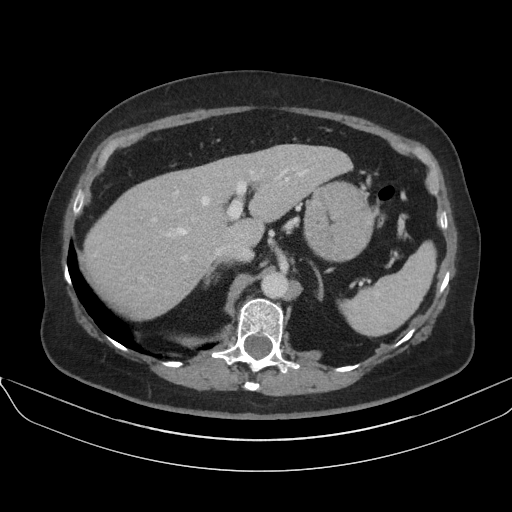
[im 127/146  soft-tissue]
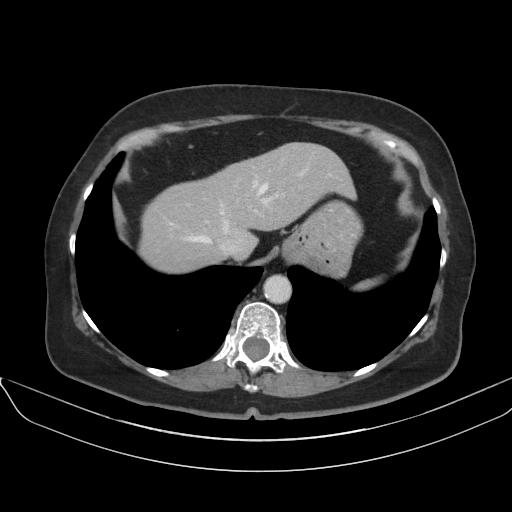
[im 136/146  soft-tissue]
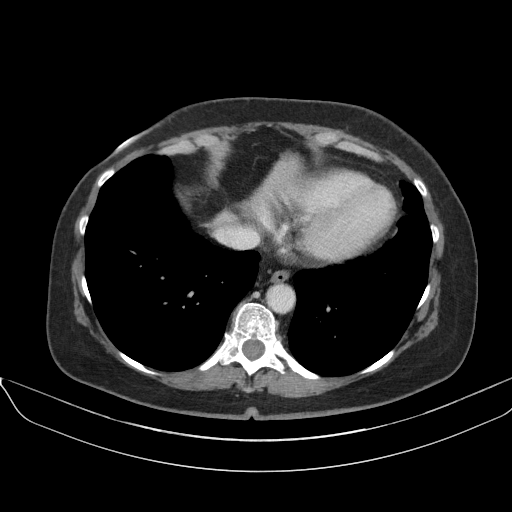

[Series 5: abd/pel cor w · coronal · 0.89mm/px · 3 of 125 slices shown]
[im 42/125  soft-tissue]
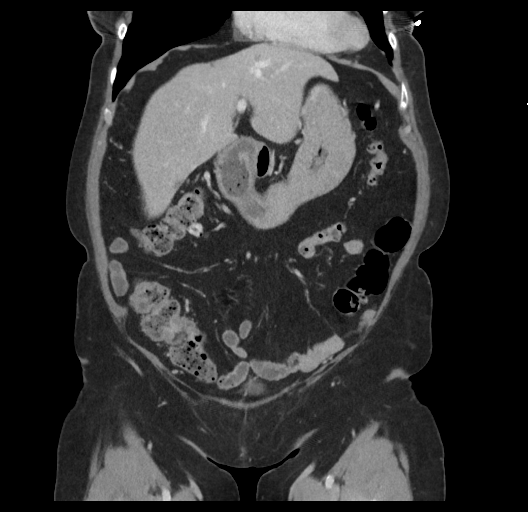
[im 56/125  soft-tissue]
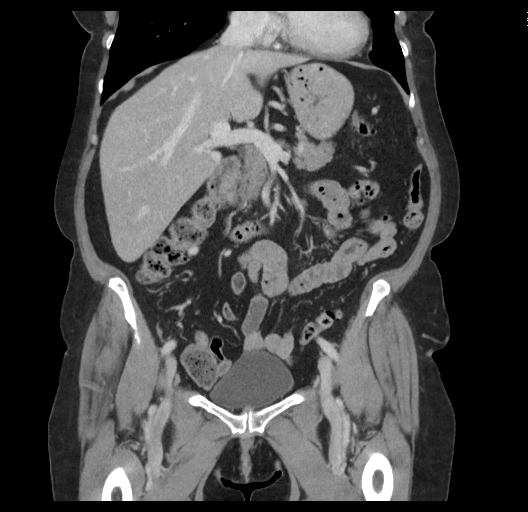
[im 69/125  soft-tissue]
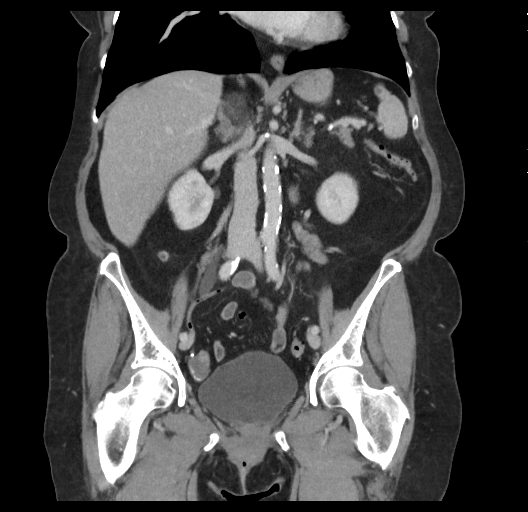

[16 of 46 positions shown; findings below may reference images not displayed]

FINDINGS: LUNG BASES: Lung bases are clear. No pleural effusions. 
HEPATOBILIARY: No mass or biliary dilatation. No gallstones. Mild diffuse fatty 
infiltration of the liver. 
SPLEEN: Normal in size. 
PANCREAS: No ductal dilatation or mass. 
ADRENALS: No mass. 
GENITOURINARY: No enhancing mass or hydronephrosis.  Right renal cyst measuring 
up to 4.2 cm. Bladder is unremarkable. 
LYMPH NODES: No adenopathy. 
STOMACH, SMALL BOWEL AND COLON: No bowel wall thickening or obstruction. Stomach 
is decompressed and difficult to assess. Sigmoid diverticulosis without active 
diverticulitis 
VASCULAR STRUCTURES: No aneurysm. Mild atherosclerotic changes. 
MUSCULOSKELETAL: No acute osseous abnormality. Scattered degenerative changes. 
ADDITIONAL FINDINGS: Surgical clips in the low anterior abdominal wall near the 
midline which may been site of previous hernia repair, with no recurrent hernia 
at this site. There is however a small supraumbilical midline ventral hernia 
containing omental fat measuring up to 2.5 cm x 1.5 cm. No other hernia seen in 
the abdomen or pelvis.
IMPRESSION: Surgical clips in the low anterior abdominal wall near the midline which may 
been site of previous hernia repair, with no recurrent hernia at this site. 
There is however a small supraumbilical midline ventral hernia containing 
omental fat measuring up to 2.5 cm x 1.5 cm. No other hernia seen in the abdomen 
or pelvis. 
Sigmoid diverticulosis without active diverticulitis. 
RADIATION DOSE REDUCTION: All CT scans are performed using radiation dose 
reduction techniques, when applicable.  Technical factors are evaluated and 
adjusted to ensure appropriate moderation of exposure.  Automated dose 
management technology is applied to adjust the radiation doses to minimize 
exposure while achieving diagnostic quality images.

## 2021-11-30 IMAGING — MR MRI RIGHT SHOULDER WITHOUT CONTRAST
4 of 6 series · 22 of 40 positions shown · non-contrast
Comparison: None

________________________________________________________________________________________________ 
MRI RIGHT SHOULDER WITHOUT CONTRA, 11/30/2021 [DATE]: 
CLINICAL INDICATION: Right shoulder pain. Two prior right shoulder surgery is 
(reports not available at this time). Evaluate for recurrent rotator cuff tear.
TECHNIQUE: Multiplanar, multiecho position MR images of the shoulder were 
performed without contrast. Images were reviewed and arthrogram was felt to be 
non-additive. Patient was scanned on a 1.5T magnet.

[Series 201: survey right · axial · right · 10.0mm · 0.71mm/px · z∈[-40,+125]mm · 4 of 15 slices shown]
[im 1/15]
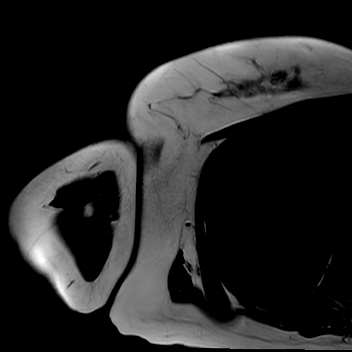
[im 5/15]
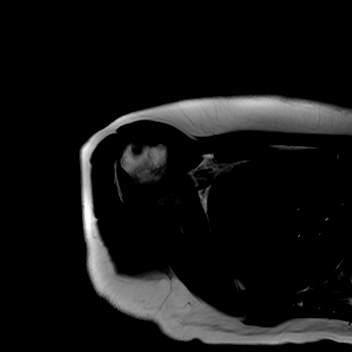
[im 10/15]
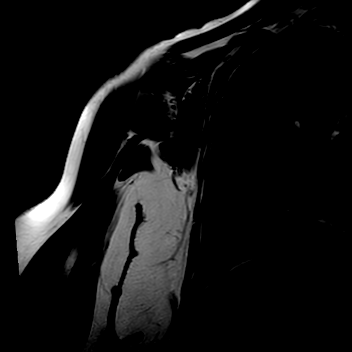
[im 15/15]
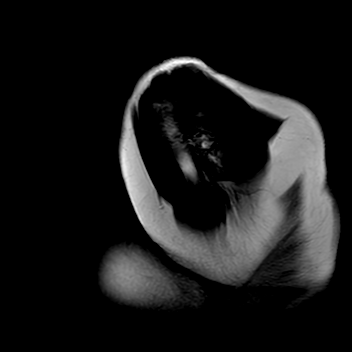

[Series 301: (person_name)_(person_name)_(person_name) right · axial · right · 3.0mm · 0.35mm/px · z∈[+14,+101]mm · 8 of 28 slices shown]
[im 1/28]
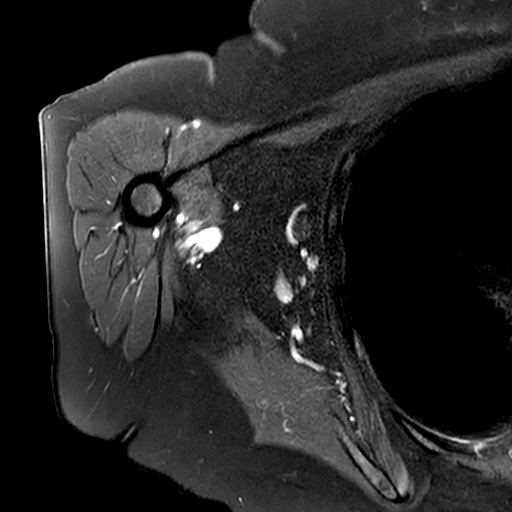
[im 4/28]
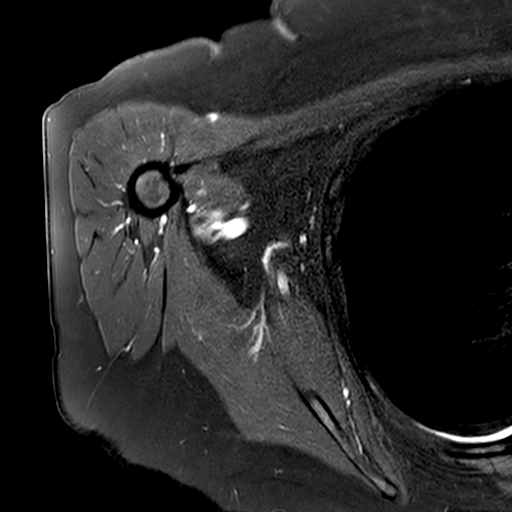
[im 8/28]
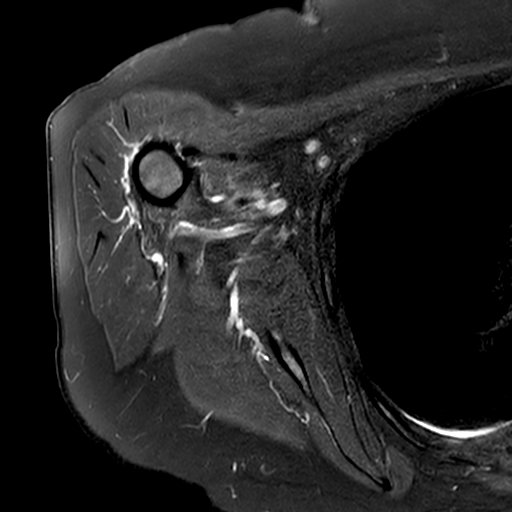
[im 12/28]
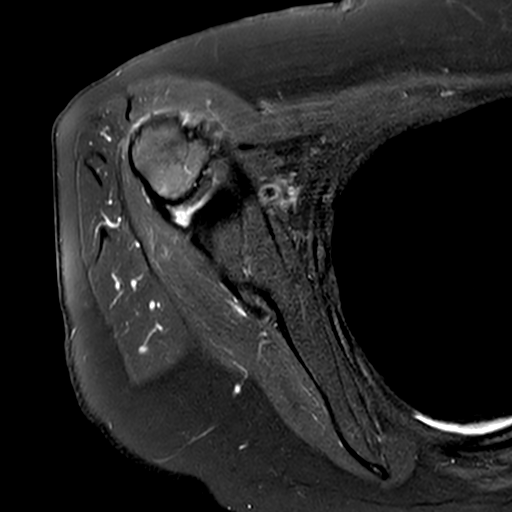
[im 16/28]
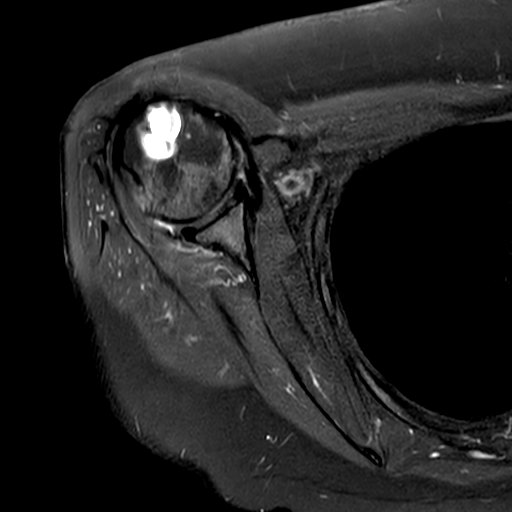
[im 20/28]
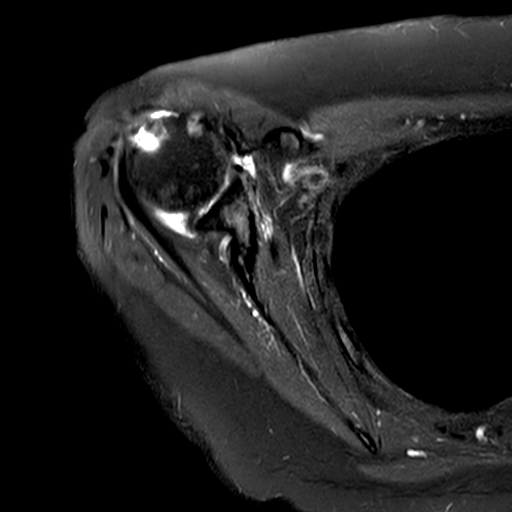
[im 24/28]
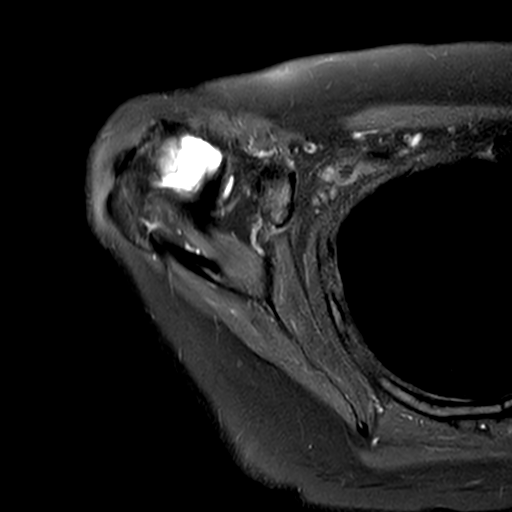
[im 28/28]
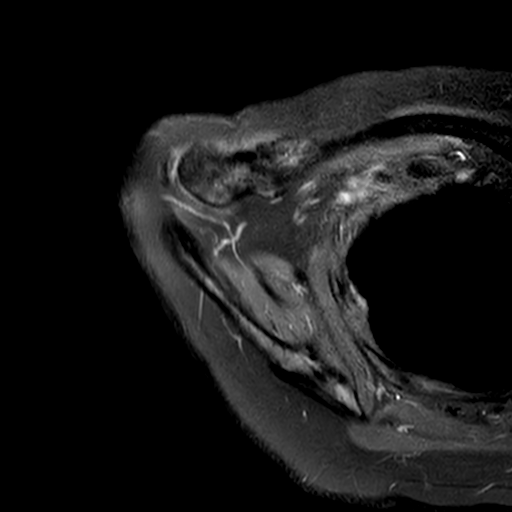

[Series 401: t2_fs_sag right · oblique · right · 3.0mm · 0.37mm/px · 7 of 30 slices shown]
[im 1/30]
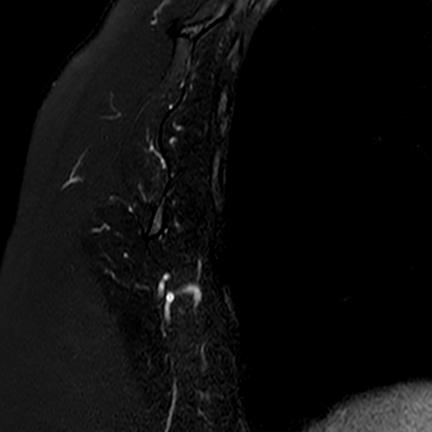
[im 5/30]
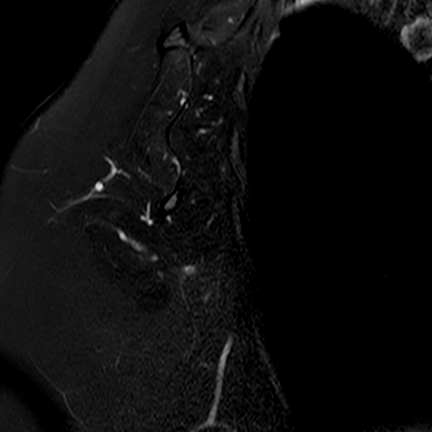
[im 9/30]
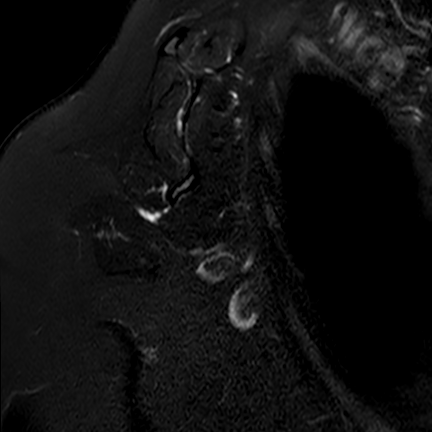
[im 13/30]
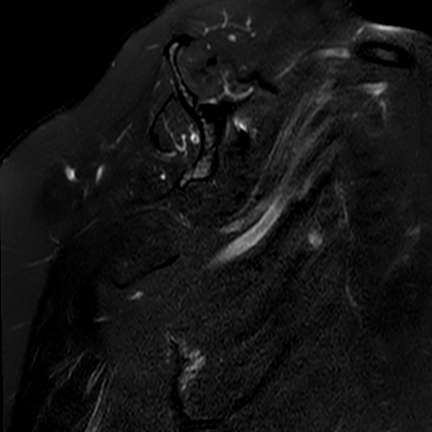
[im 17/30]
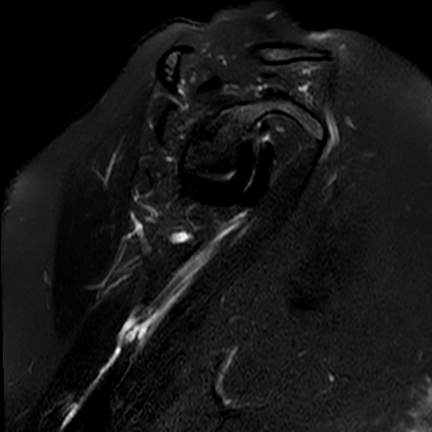
[im 21/30]
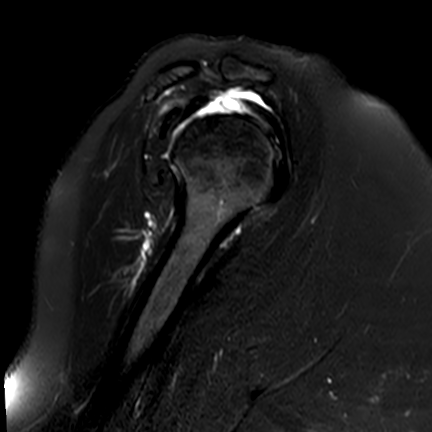
[im 25/30]
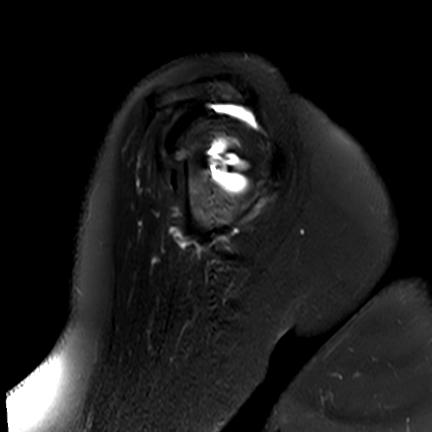

[Series 501: t1_sag right · oblique · right · 3.0mm · 0.31mm/px · 3 of 30 slices shown]
[im 5/30]
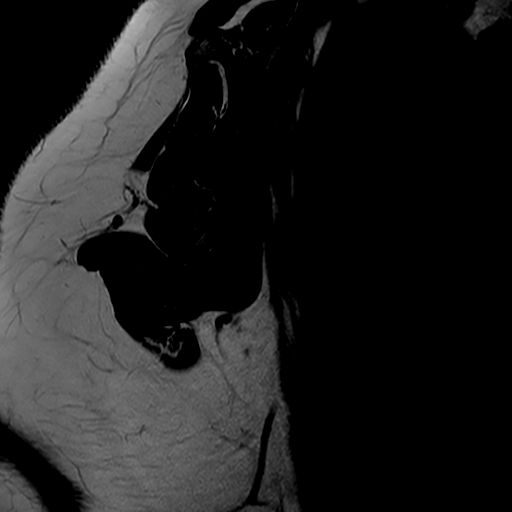
[im 17/30]
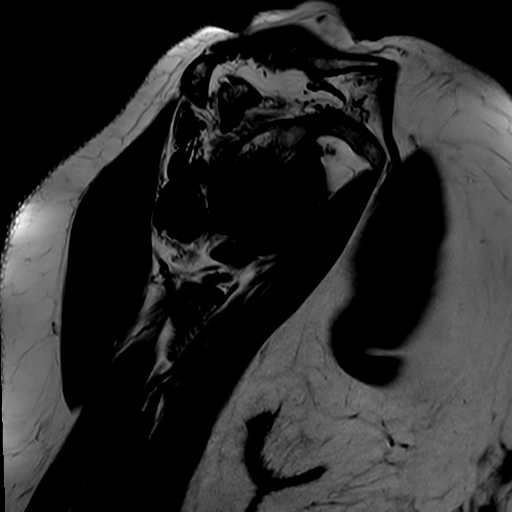
[im 25/30]
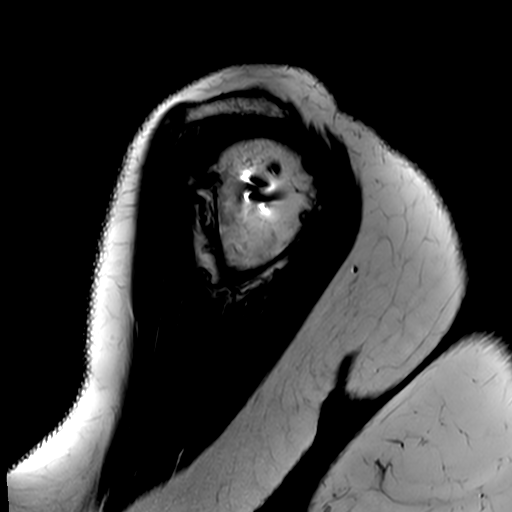

[22 of 40 positions shown; findings below may reference images not displayed]

FINDINGS: ROTATOR CUFF: Status post rotator cuff repair. Full-thickness distal 
supraspinatus tendon re-tear measures 2.0 cm in AP dimensions with 3.1 cm medial 
retraction of the tendinous remnant. The infraspinatus, subscapularis and teres 
minor tendons are preserved. Mild supraspinatus and infraspinatus fatty muscular 
atrophy. 
ACROMIOCLAVICULAR JOINT: Mild AC joint degenerative change and subacromial 
enthesophyte. The coracoacromial ligament is intact without prominent spurring 
at the acromial attachment. The acromioclavicular and coracoclavicular ligaments 
are preserved. The acromium is normal in morphology. 
GLENOHUMERAL JOINT: The humeral head is well located within the glenoid fossa. 
Mild glenohumeral joint degenerative change with partial thickness 
chondromalacia. Degenerative superior labral tear. No paralabral cyst. 
Tendinosis and partial thickness tearing of the long head of the biceps tendon 
within the bicipital groove. Small shoulder joint effusion with extension into 
the subacromial/subdeltoid bursa. 
BONES AND SOFT TISSUES: Status post double row repair with metallic and 
bioabsorbable suture anchors in the greater tuberosity/lateral humeral head. The 
bone marrow signal intensity is negative for fracture. No Hill-Sachs defect. The 
axillary region is negative. Subcutaneous tissues are negative. Tiny right 
pleural effusion.
IMPRESSION: 1.  Status post rotator cuff repair with 2.0 cm full-thickness distal 
supraspinatus tendon re-tear.  
2.  Mild supraspinatus and infraspinatus fatty muscular atrophy. 
3.  Tendinosis and partial thickness tearing of the long head of the biceps 
tendon within the bicipital groove.  
4.  Mild AC joint degenerative change and subacromial enthesophyte.  
5.  Mild glenohumeral joint degenerative change, degenerative superior labral 
tear and small joint effusion. 
6.  Tiny right pleural effusion.

## 2022-10-30 IMAGING — CT CT [PERSON_NAME] KNEE WO CONTRAST
1 of 3 series · 9 of 14 positions shown, 12 images · non-contrast
Comparison: There are no previous exams available for comparison.

________________________________________________________________________________________________ 
CT NADER JOUBERT KNEE WO CONTRAST, 10/30/2022 [DATE]: 
CLINICAL INDICATION: Left knee pain. Unilateral primary osteoarthritis. 
Preoperative for surgery on 11/16/2022.
TECHNIQUE: The left lower extremity was scanned to the ankle without contrast on 
a high-resolution CT scanner using dose reduction techniques and PRAGT protocol. 
Routine MPR reconstructions were performed.

[Series 3: lt knee · axial · 0.45mm/px · z∈[-633,-412]mm · 9 of 396 slices shown, 12 images]
[im 40/396  soft-tissue]
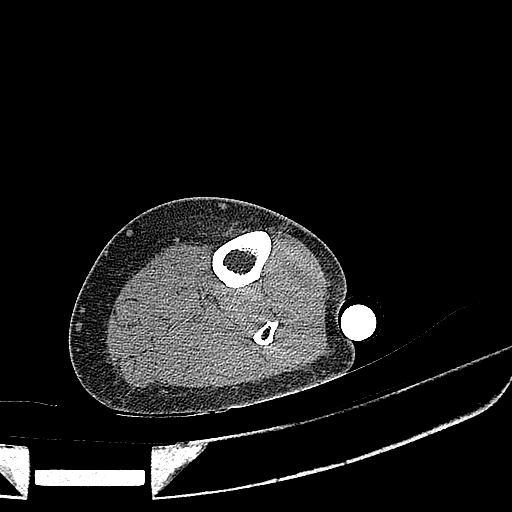
[im 40/396  bone]
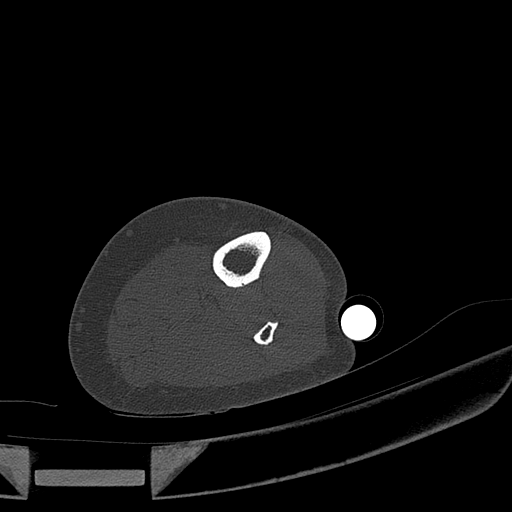
[im 80/396  bone]
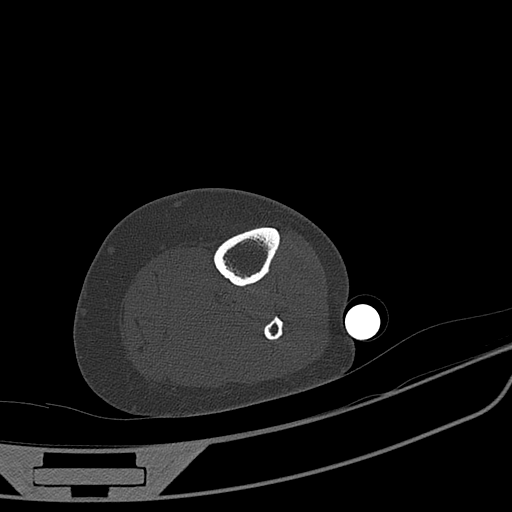
[im 119/396  bone]
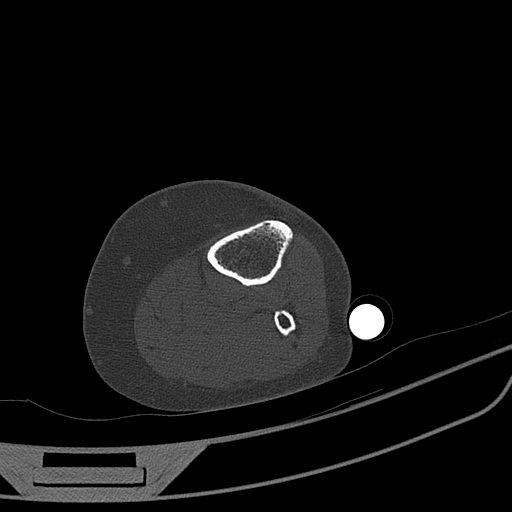
[im 159/396  bone]
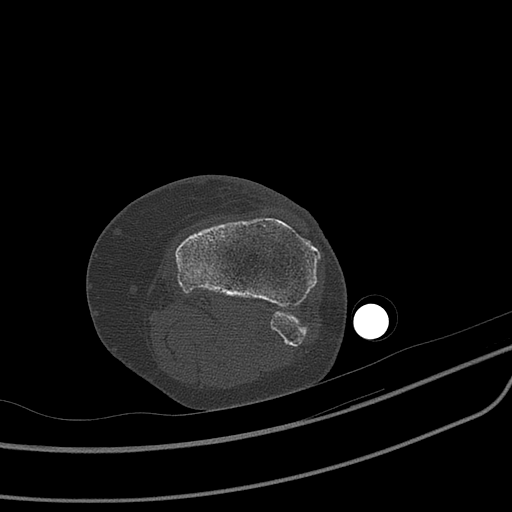
[im 198/396  soft-tissue]
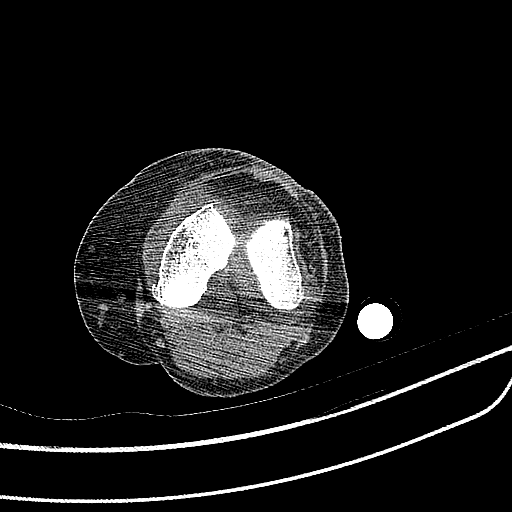
[im 198/396  bone]
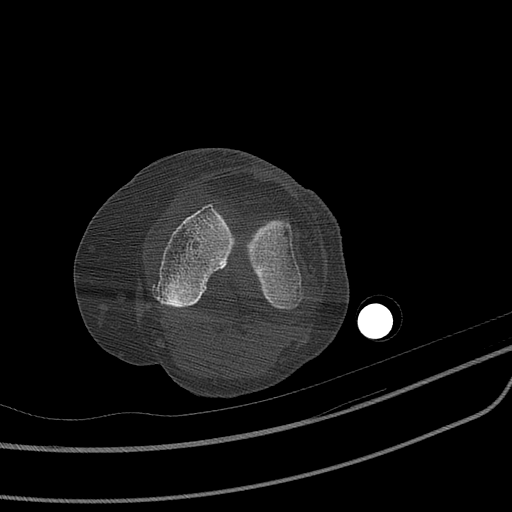
[im 238/396  bone]
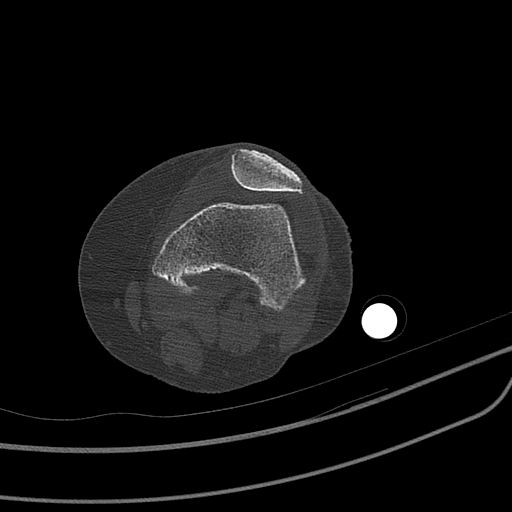
[im 277/396  bone]
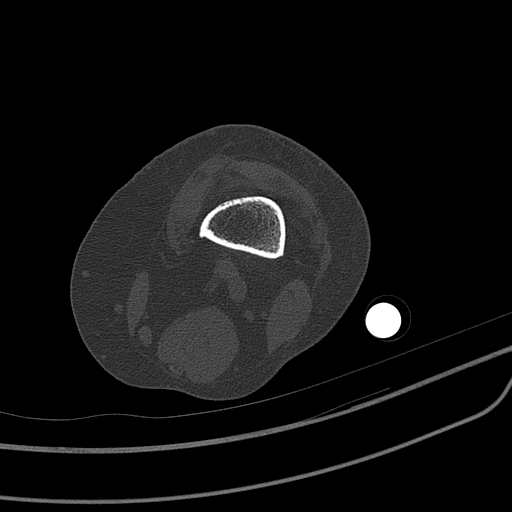
[im 317/396  bone]
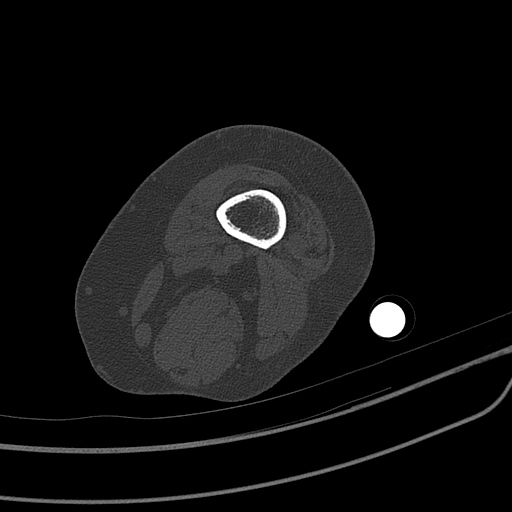
[im 356/396  soft-tissue]
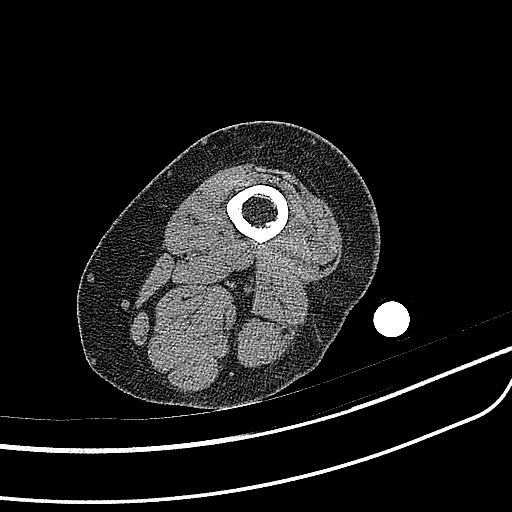
[im 356/396  bone]
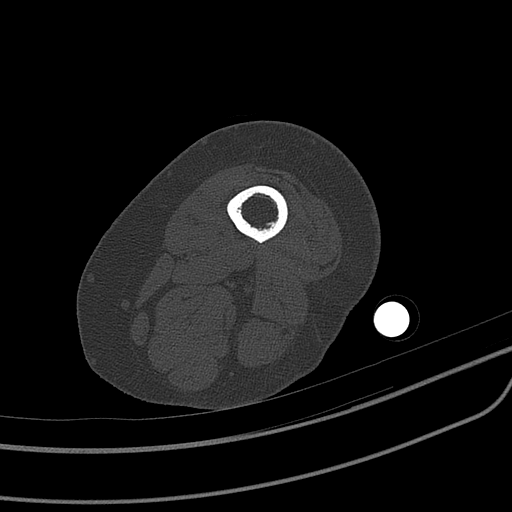

[9 of 14 positions shown; findings below may reference images not displayed]

FINDINGS: HIP: Mild joint space narrowing. 
KNEE: Tricompartmental degenerative changes with joint space and greatest 
involving the medial compartment with subcortical sclerosis, cystic change, 
large osteophytic spurring and osseous remodeling. 
ANKLE: Preserved. 
ADDITIONAL FINDINGS: Sigmoid diverticula. Musculature is symmetric.
IMPRESSION: Left lower extremity CT using PRAGT protocol. 
RADIATION DOSE REDUCTION: All CT scans are performed using radiation dose 
reduction techniques, when applicable. Technical factors are evaluated and 
adjusted to ensure appropriate moderation of exposure. Automated dose management 
technology is applied to adjust the radiation doses to minimize exposure while 
achieving diagnostic quality images.

## 2023-05-29 ENCOUNTER — Other Ambulatory Visit
Admission: RE | Admit: 2023-05-29 | Discharge: 2023-05-29 | Disposition: A | Discharge status: Home / Self Care | Payer: Medicare Other | Source: Ambulatory Visit | Attending: Dermatopathology | Admitting: Dermatopathology

## 2023-05-29 DIAGNOSIS — L821 Other seborrheic keratosis: Secondary | ICD-10-CM | POA: Insufficient documentation

## 2023-06-06 LAB — SURGICAL PATHOLOGY

## 2023-10-21 IMAGING — DX CHEST PA AND LATERAL
2 series · 2 of 2 positions shown · non-contrast
Comparison: October 2020

________________________________________________________________________________________________ 
CLINICAL INDICATION: Chronic cough.

[PA]
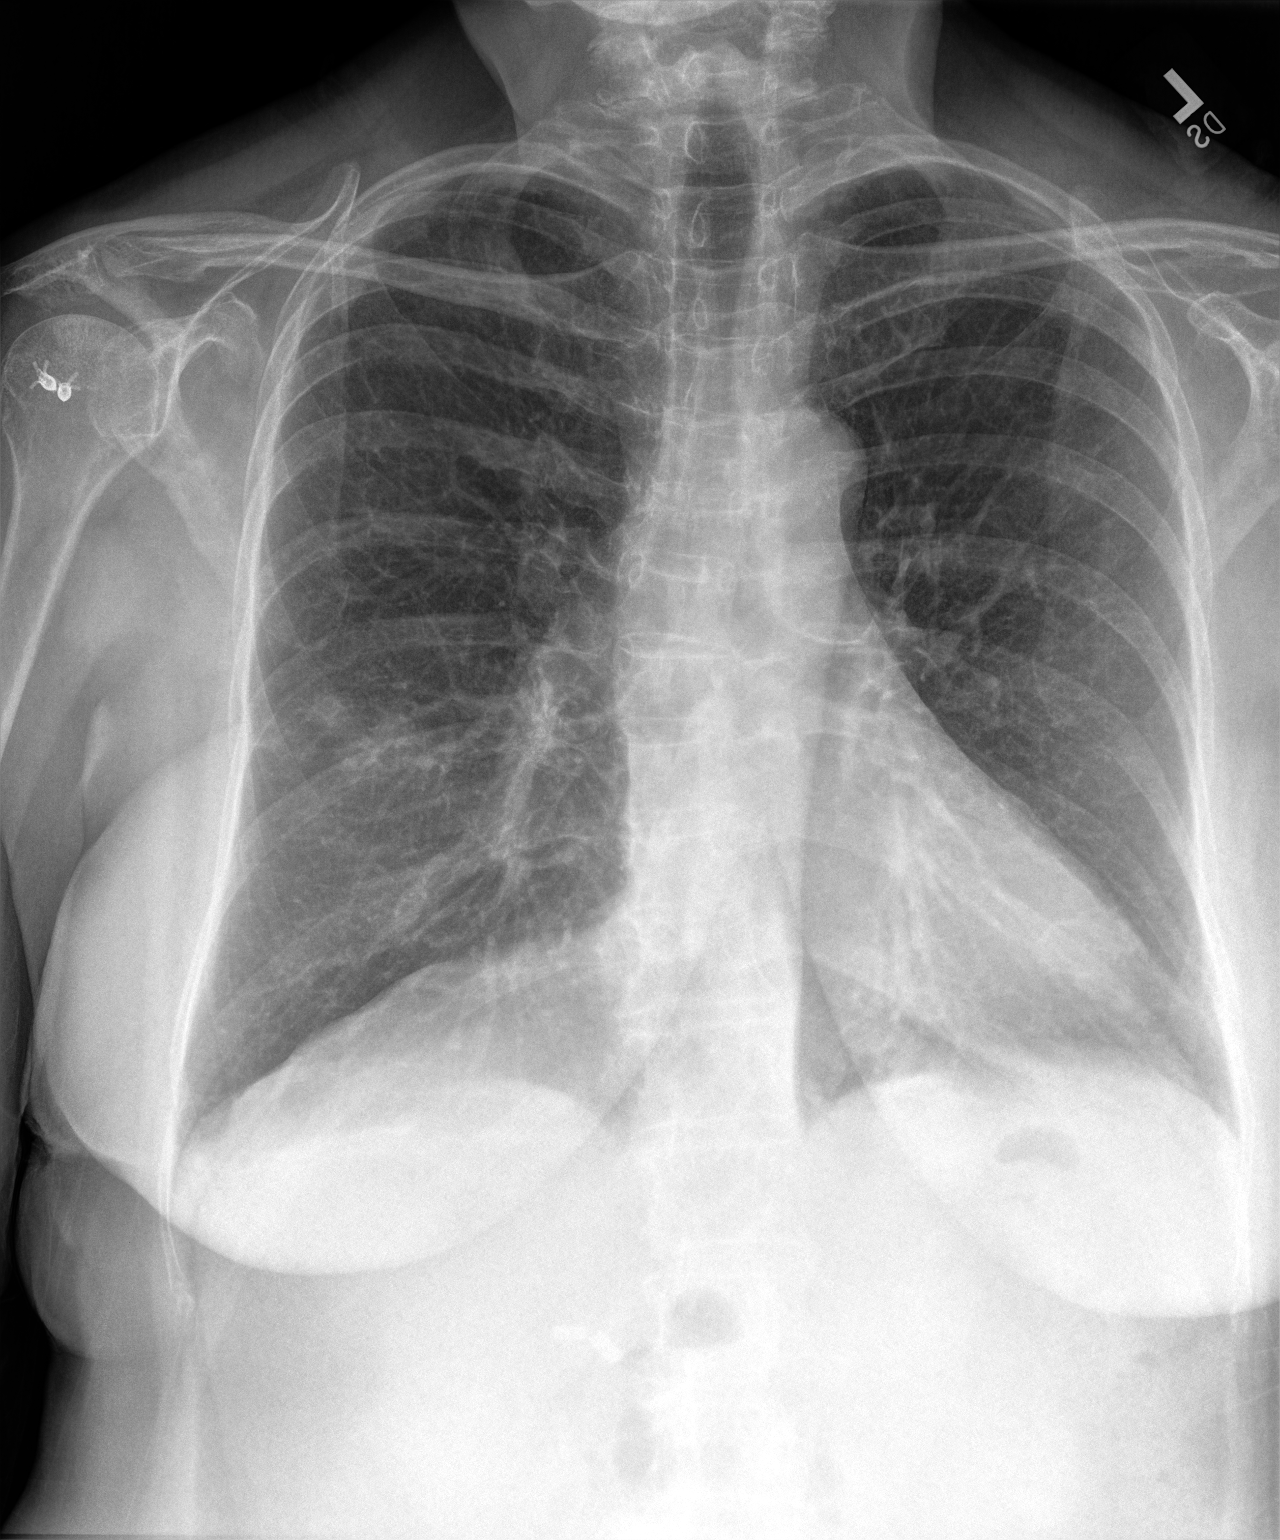

[lateral]
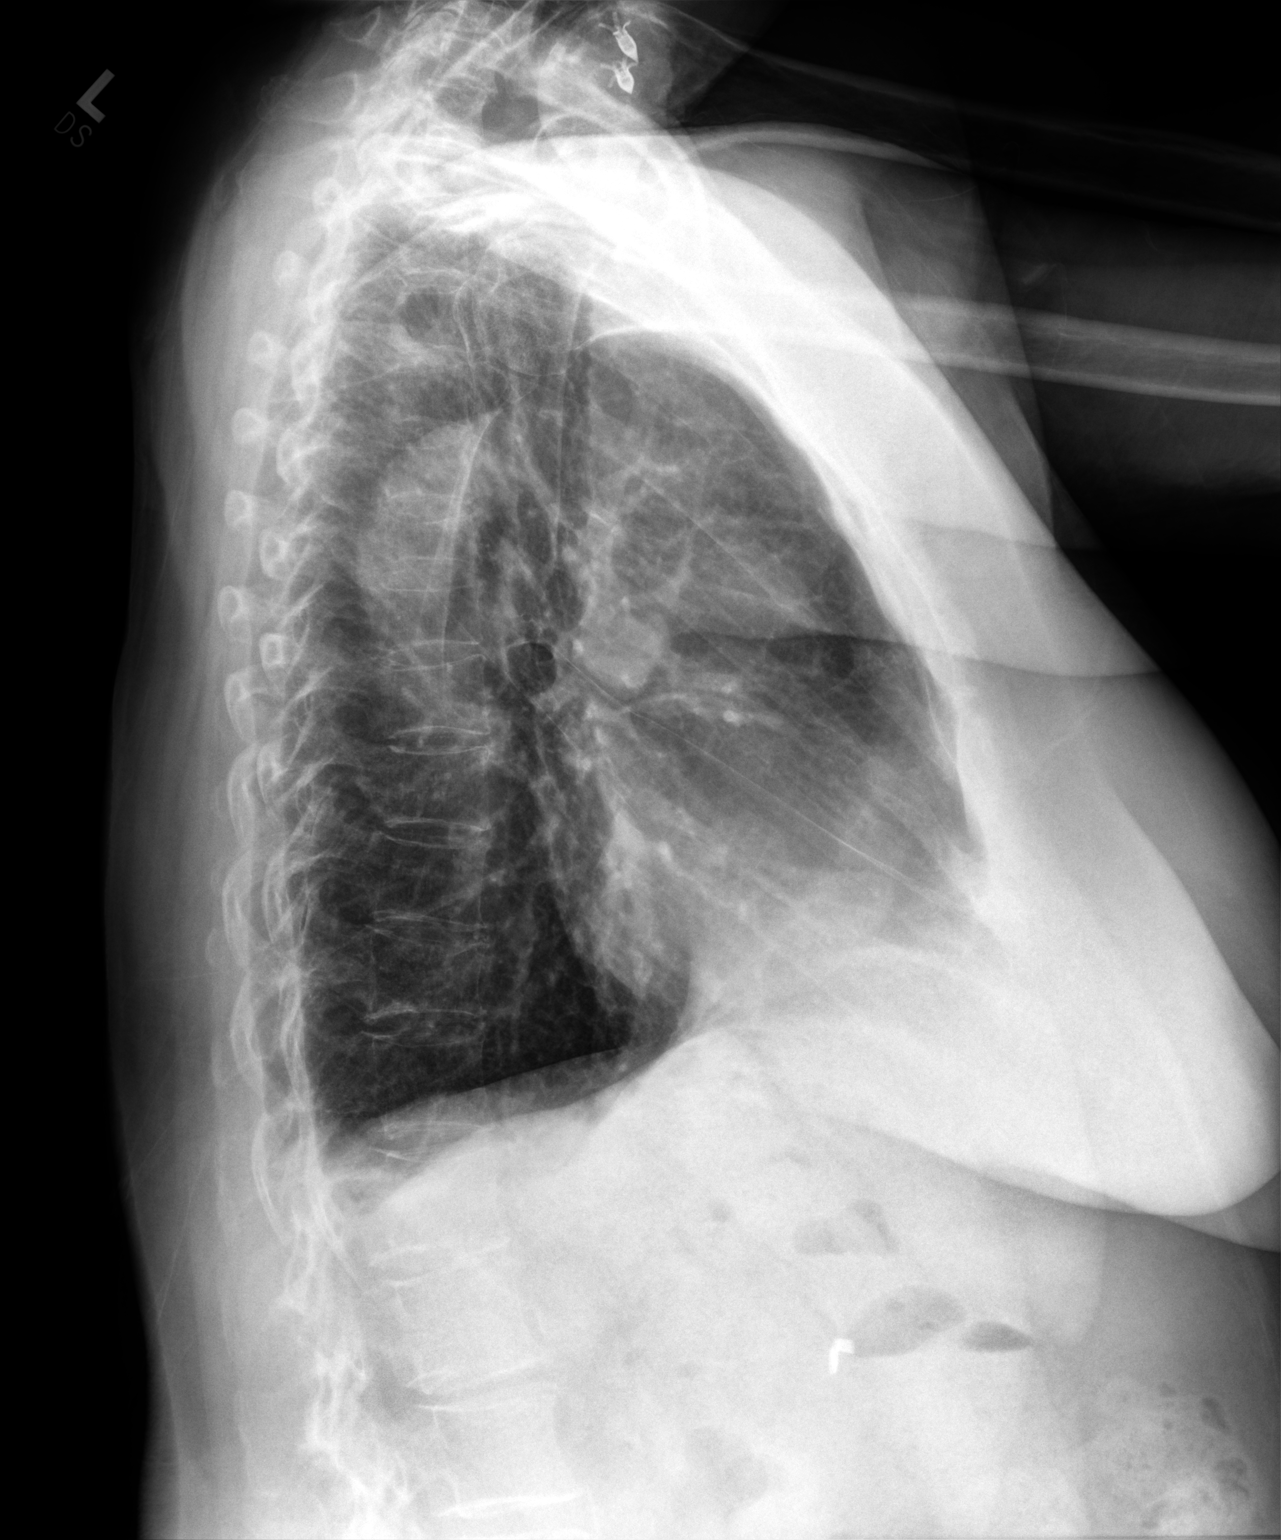

[2 of 2 positions shown; findings below may reference images not displayed]

FINDINGS: Increased density seen laterally within the right thorax. I do not 
see this on the older exam. I do not see this on the lateral view. Question 
subtle infiltrate. There are degenerative changes seen in the spine. Postop 
changes right shoulder.
IMPRESSION: Suspected right-sided infiltrate. Recommend short-term follow-up with repeat 
chest x-ray in 3-4 weeks.

## 2023-11-11 IMAGING — DX CHEST PA AND LATERAL
2 series · 2 of 2 positions shown · non-contrast
Comparison: October 21, 2023

________________________________________________________________________________________________ 
CLINICAL INDICATION: Chronic cough.

[PA]
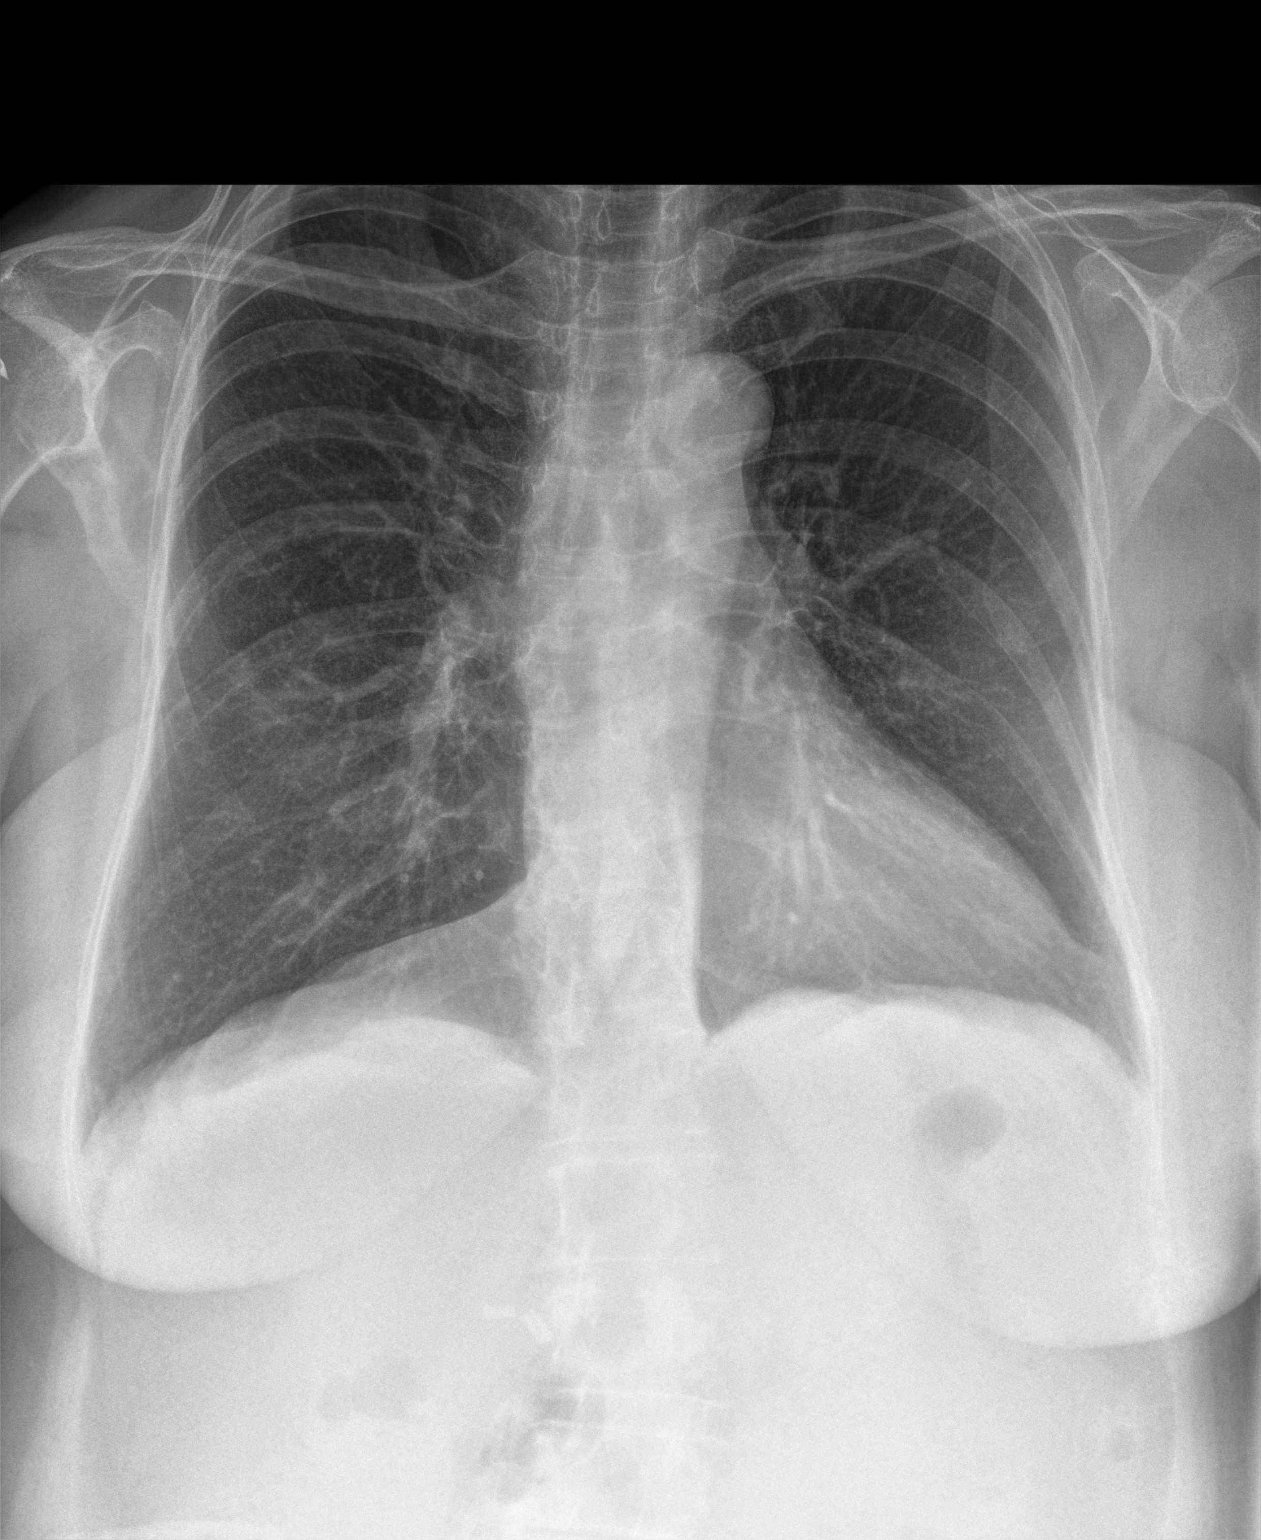

[lateral]
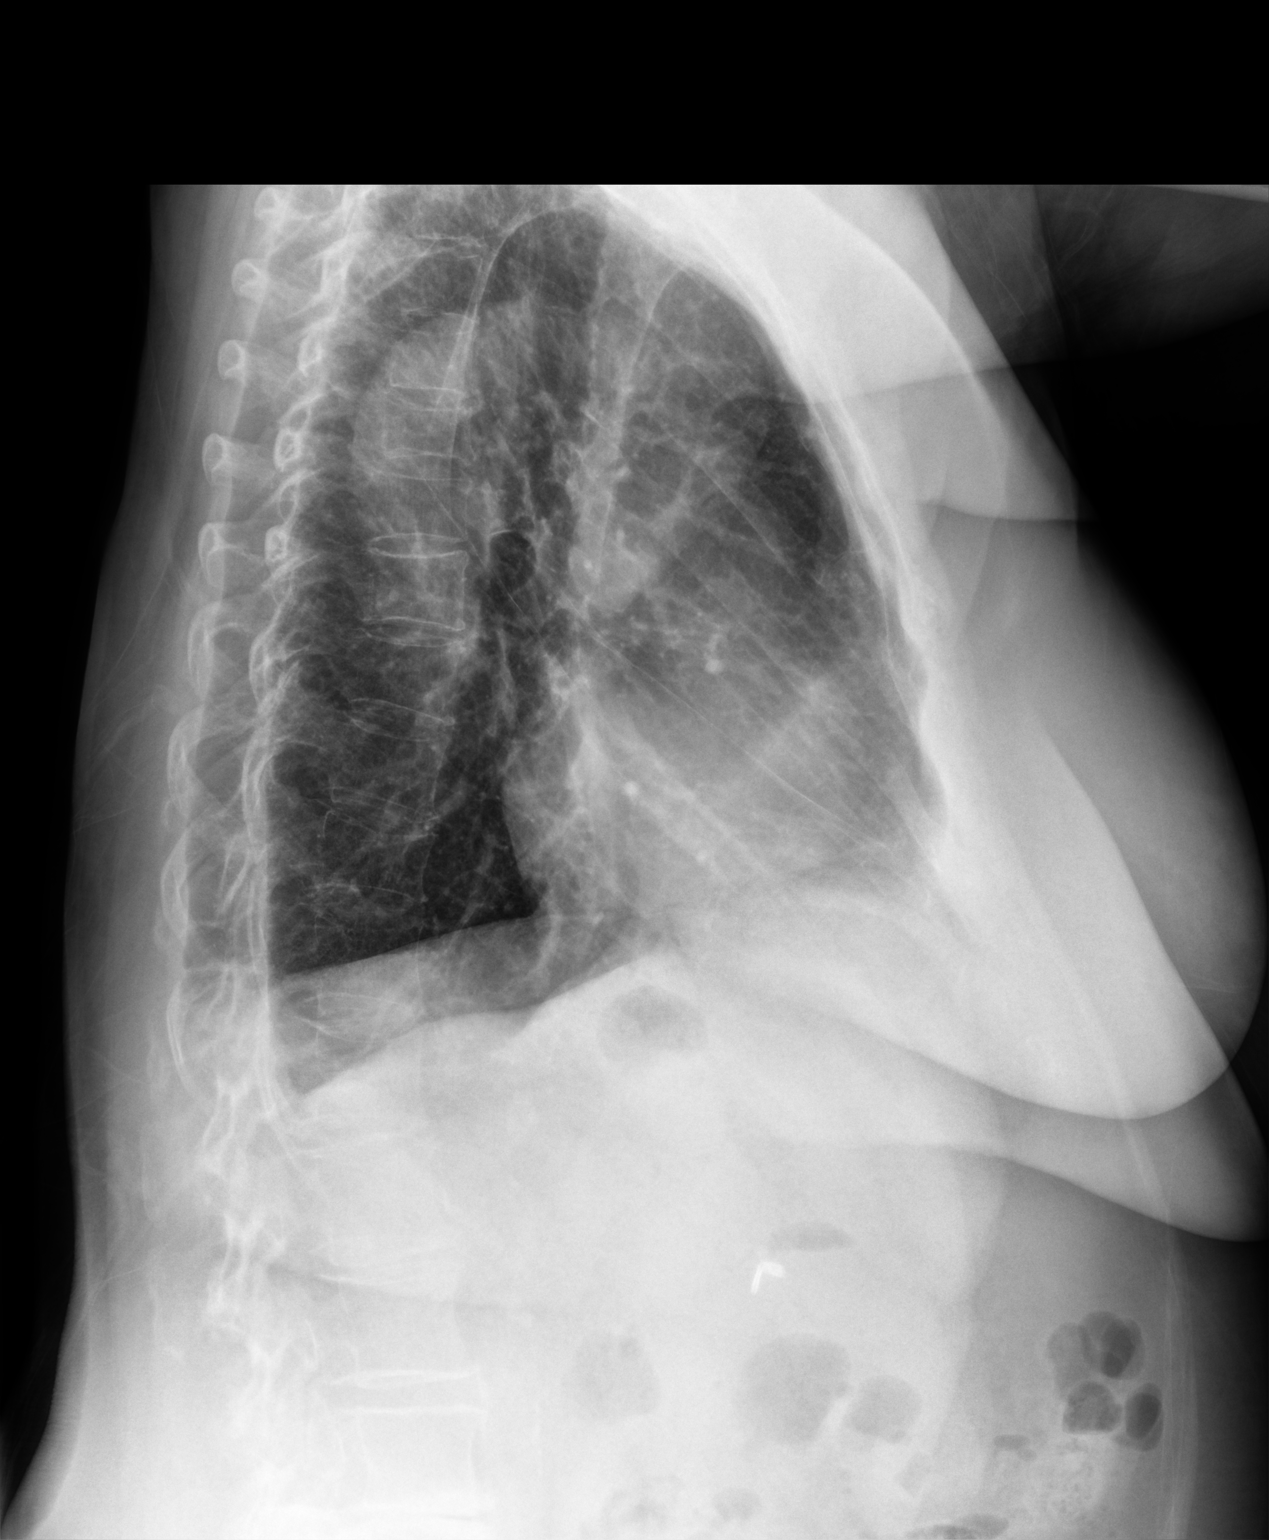

[2 of 2 positions shown; findings below may reference images not displayed]

FINDINGS: Persistent increased density seen mid right lung improved compared to 
the prior exam could represent resolving infiltrate. Does require continued 
follow-up versus CT scan. No pleural effusion seen.
IMPRESSION: Improving chest x-ray appearance. Does require continued follow-up.

## 2023-11-28 IMAGING — CT CT CHEST WITH CONTRAST
2 of 4 series · 13 of 36 positions shown, 16 images · IV contrast (APPLIED)
Comparison: Chest x-ray from 11/11/2023 and other prior chest x-rays.

________________________________________________________________________________________________ 
CT CHEST WITH CONTRAST, 11/28/2023 [DATE]: 
CLINICAL INDICATION: Chronic cough 
A search for DICOM formatted images was conducted for prior CT imaging studies 
completed at a non-affiliated media free facility.
TECHNIQUE: The chest was scanned from base of neck through the lung bases with 
50 mL of Isovue 300 MDV injected intravenously on a high resolution low dose CT 
scanner. Routine MPR and MIP reconstruction images were performed.

[Series 4: axial · axial · 0.70mm/px · z∈[-298,-28]mm · 10 of 166 slices shown, 13 images]
[im 16/166  mediastinal]
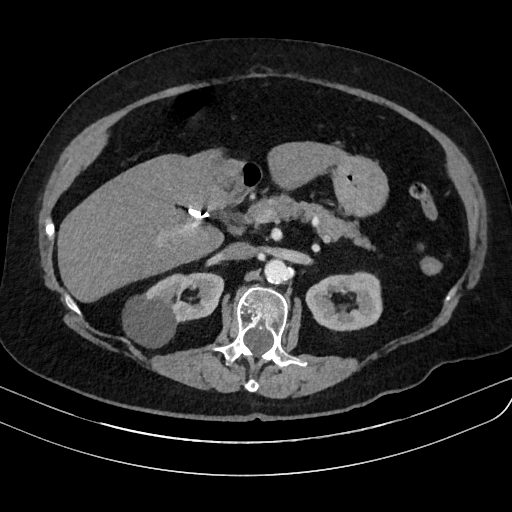
[im 16/166  lung]
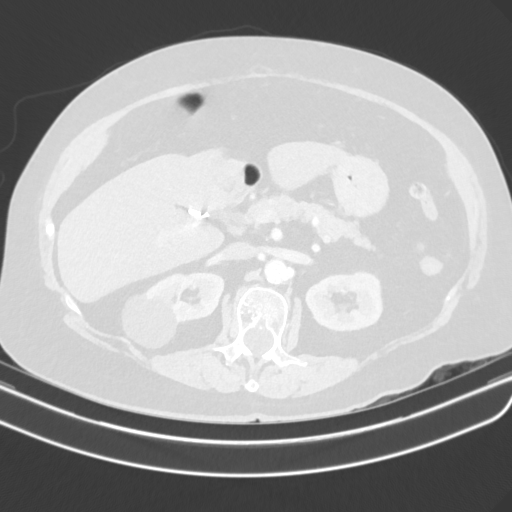
[im 31/166  lung]
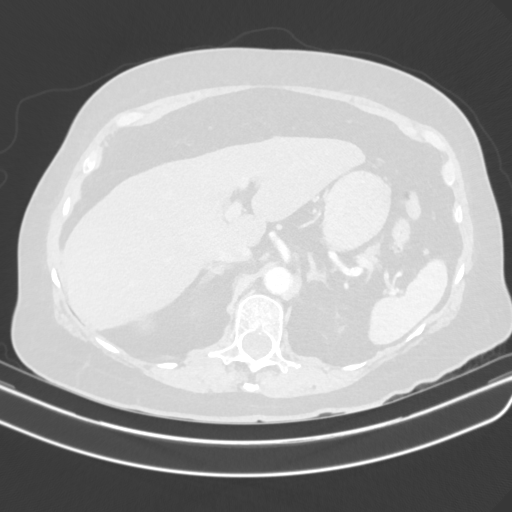
[im 46/166  lung]
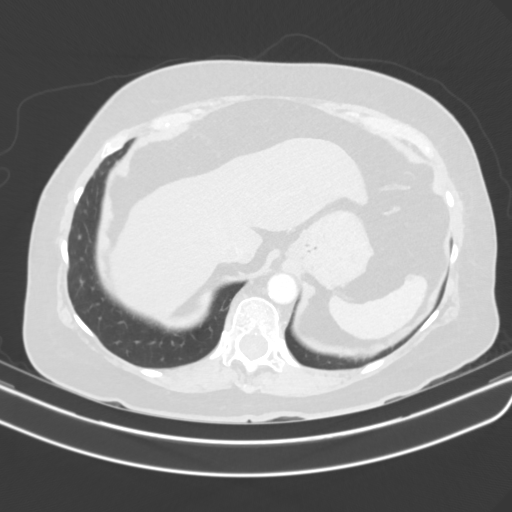
[im 61/166  lung]
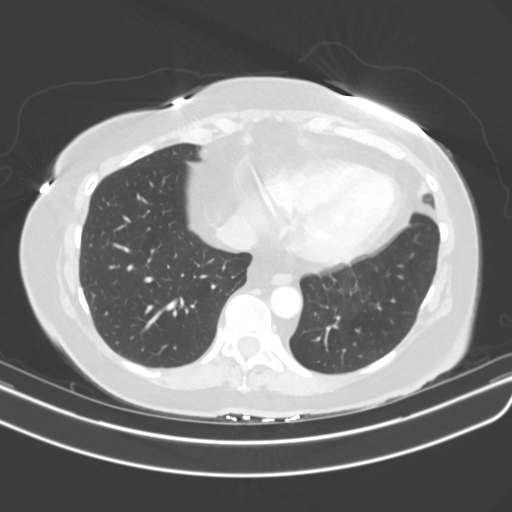
[im 76/166  mediastinal]
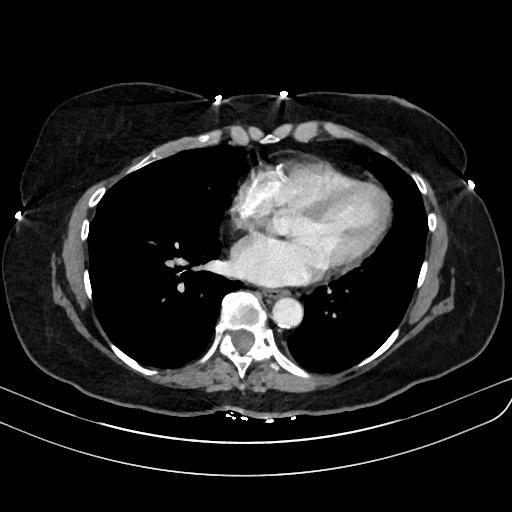
[im 76/166  lung]
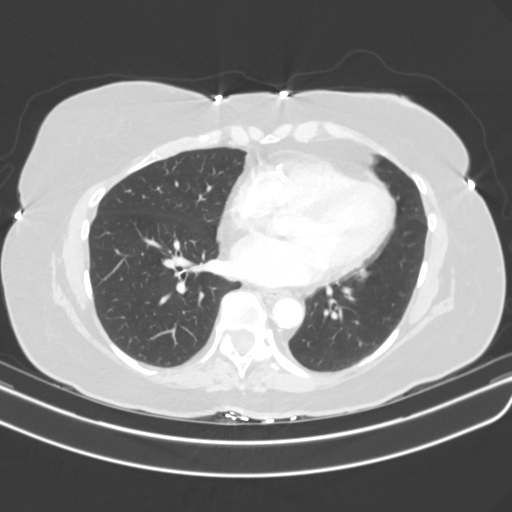
[im 91/166  lung]
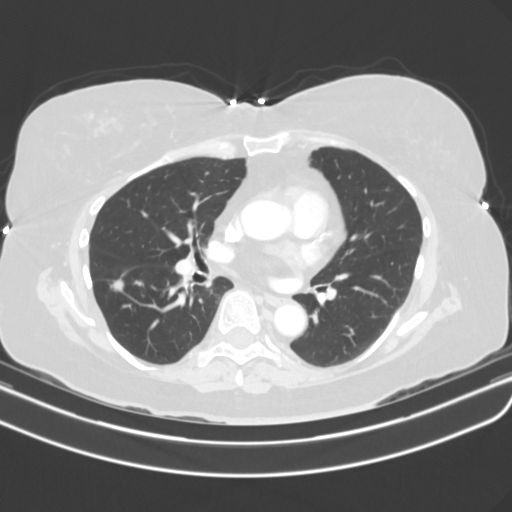
[im 106/166  lung]
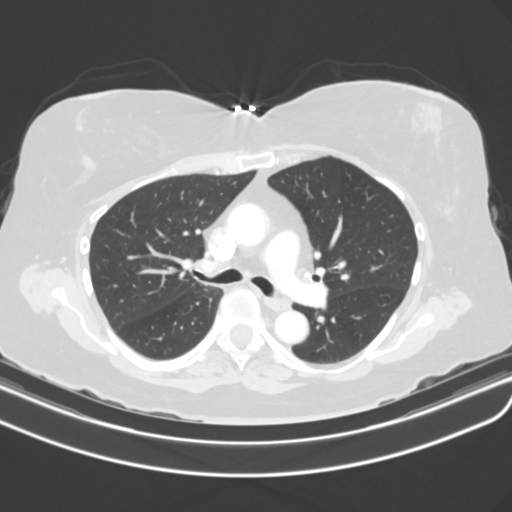
[im 121/166  lung]
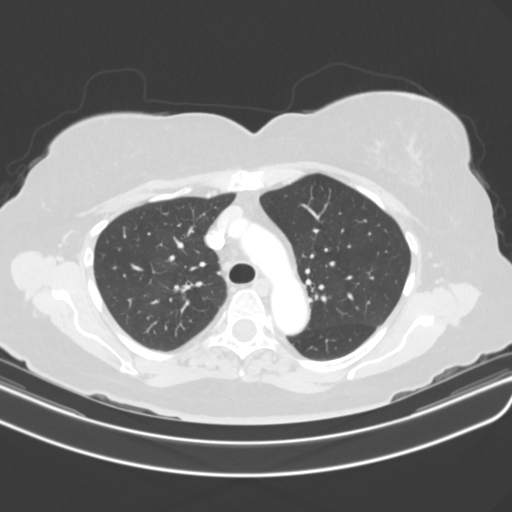
[im 136/166  mediastinal]
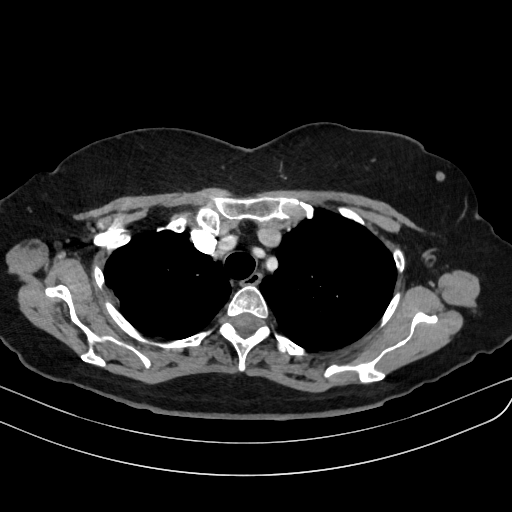
[im 136/166  lung]
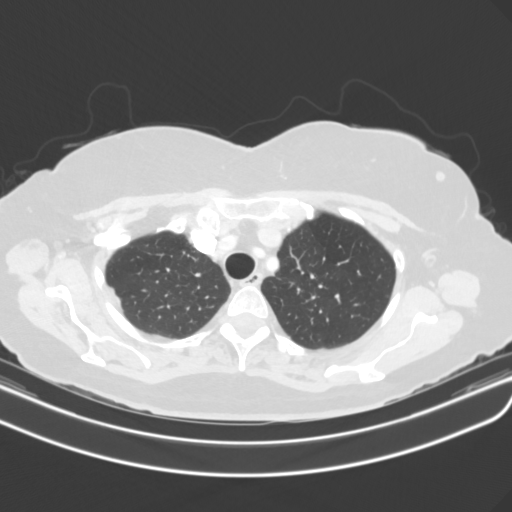
[im 151/166  lung]
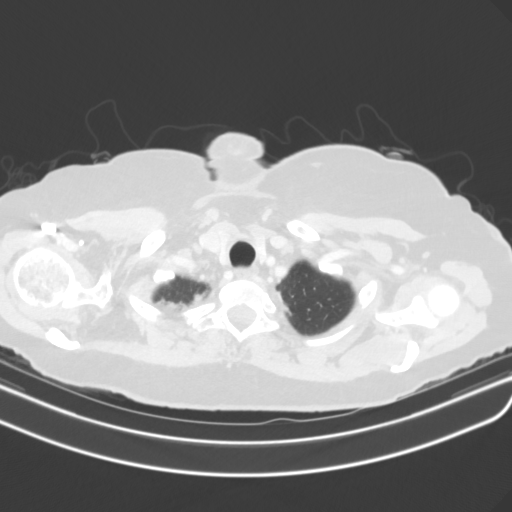

[Series 6: cor · coronal · 0.65mm/px · 3 of 113 slices shown]
[im 23/113  lung]
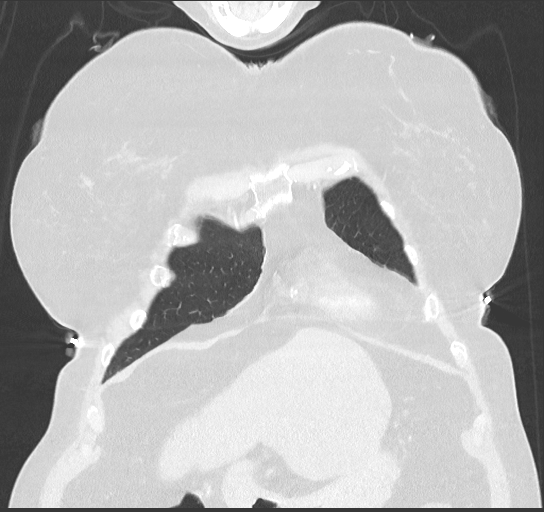
[im 45/113  lung]
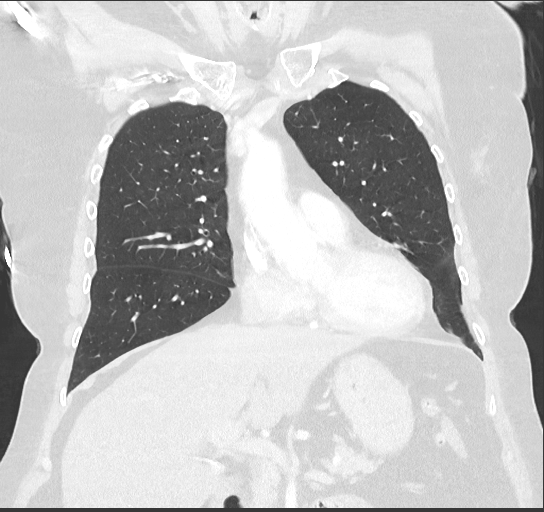
[im 68/113  lung]
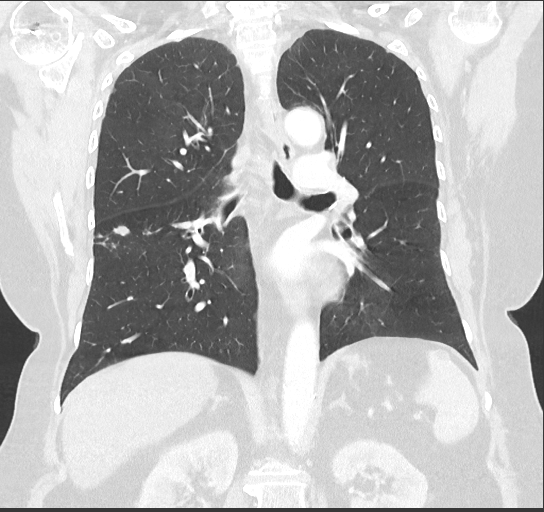

[13 of 36 positions shown; findings below may reference images not displayed]

Count of known CT and Cardiac Nuclear Medicine studies performed in the previous 
12 months = 0.
FINDINGS: LUNGS AND PLEURA:  Grouped nodular densities with ill-defined borders in the 
superior segment of the right upper lobe measuring up to 9 mm x 7 mm as seen on 
axial image #77 of series #5. As these findings are improving on chest x-ray 
would favor this to be due to infectious/inflammatory etiology rather than 
neoplastic etiology but would recommend short-term follow-up CT in 3-6 months as 
per reference below to ensure continued improvement.  Mild biapical pleural 
thickening or scarring better seen on the right than left. No other nodular 
densities or infiltrates seen in the other remaining segments of the lungs. 
MEDIASTINUM:  No adenopathy. Normal heart size. No pericardial effusion. Mild 
coronary artery calcifications noted. 
CHEST WALL/AXILLA: No adenopathy. Well-circumscribed Nodular solid density with 
Hounsfield units of +34 in the lateral left breast as seen on axial image #31 of 
series #4 measuring up to 0.6 cm. May consider follow-up with mammogram. 
UPPER ABDOMEN: Gallbladder is been removed. Calcified splenic artery aneurysm 
measuring 7 mm x 9 mm as seen on axial image #138 of series #4. Right renal 
simple cyst measuring 4.6 x 4.0 cm. 
MUSCULOSKELETAL: No acute abnormality. Schmorls nodes at the superior plate of 
T6 and T11.
IMPRESSION: Grouped nodular densities with ill-defined borders in the superior segment of 
the right upper lobe measuring up to 9 mm x 7 mm as seen on axial image #77 of 
series #5. As these findings are improving on chest x-ray would favor this to be 
due to infectious/inflammatory etiology rather than neoplastic etiology but 
would recommend short-term follow-up CT in 3-6 months as per reference below to 
ensure continued improvement.  
Mild biapical pleural thickening or scarring better seen on the right than left. 
Well-circumscribed Nodular solid density with Hounsfield units of +34 in the 
lateral left breast as seen on axial image #31 of series #4 measuring up to
cm. May consider follow-up with mammogram. 
Calcified splenic artery aneurysm measuring 7 mm x 9 mm as seen on axial image 
#138 of series #4.  
Schmorls nodes at the superior plate of T6 and T11. 
REFERENCE: 
Recommendations for pulmonary nodule follow-up according to [HOSPITAL] 
Guidelines. 
Multiple nodules size: < 6mm 
*Low risk patients: no routine follow-up. 
*High risk patients: optional CT at 12 months. 
Multiple nodules size: 6-8mm 
*Low risk patients: follow-up at 3-6 months, then consider further follow-up at 
18-24 months. 
*High risk patients: follow-up at 3-6 months, then at 18-24 months if no change. 
Multiple nodules size: > 8mm 
*Low risk patients: follow-up at 3-6 months, then consider further follow-up at 
18-24 months. 
*High risk patients: follow-up at 3-6 months, then at 18-24 months if no change. 
NOTE: 
Low risk patients: minimal or absent history of smoking and or other known risk 
factors. 
High risk patients: history of smoking or of other known risk factors (e.g. 
first degree relative with lung cancer, or exposure to asbestos, radon uranium) 
If a nodule up to 8mm is partly solid or is ground glass further follow up is 
required after 24 months to exclude possible slow growing adenocarcinoma ([place]) 
Size is average of length and width. 
In patients between the ages of 50-77 where pulmonary emphysema is noted on CT, 
recommend evaluation for low dose lung cancer screening protocol if patient is 
not already enrolled; as pulmonary emphysema is an independent risk factor for 
lung cancer. 
RADIATION DOSE REDUCTION: All CT scans are performed using radiation dose 
reduction techniques, when applicable.  Technical factors are evaluated and 
adjusted to ensure appropriate moderation of exposure.  Automated dose 
management technology is applied to adjust the radiation doses to minimize 
exposure while achieving diagnostic quality images.

## 2024-01-03 IMAGING — MG MAMMOGRAPHY DIAGNOSTIC BILATERAL 3[PERSON_NAME]
8 series · 8 of 24 positions shown · non-contrast
Comparison: Comparison was made to prior examinations.

________________________________________________________________________________________________ 
******** ADDENDUM #1 ********/n 
MAMMOGRAPHY DIAGNOSTIC BILATERAL 3[facility]UMPER 01/03/2024 [DATE]: 
MAMMOGRAPHY DIAGNOSTIC BILATERAL 3[facility] J[facility], BREAST ULTRASOUND BILATERAL COMPLETE, 
01/03/2024 [DATE]: 
CLINICAL INDICATION: Dense Breasts, Unspecified
TECHNIQUE: Digital bilateral mammograms and 3-D Tomosynthesis were obtained. 
These were interpreted both primarily and with the aid of computer-aided 
detection system. Bilateral complete ultrasound the breasts including all 4 
quadrants and retroareolar regions of both breasts. 
BREAST DENSITY: (Level C) The breasts are heterogeneously dense, which may 
obscure small masses.

[R CC]
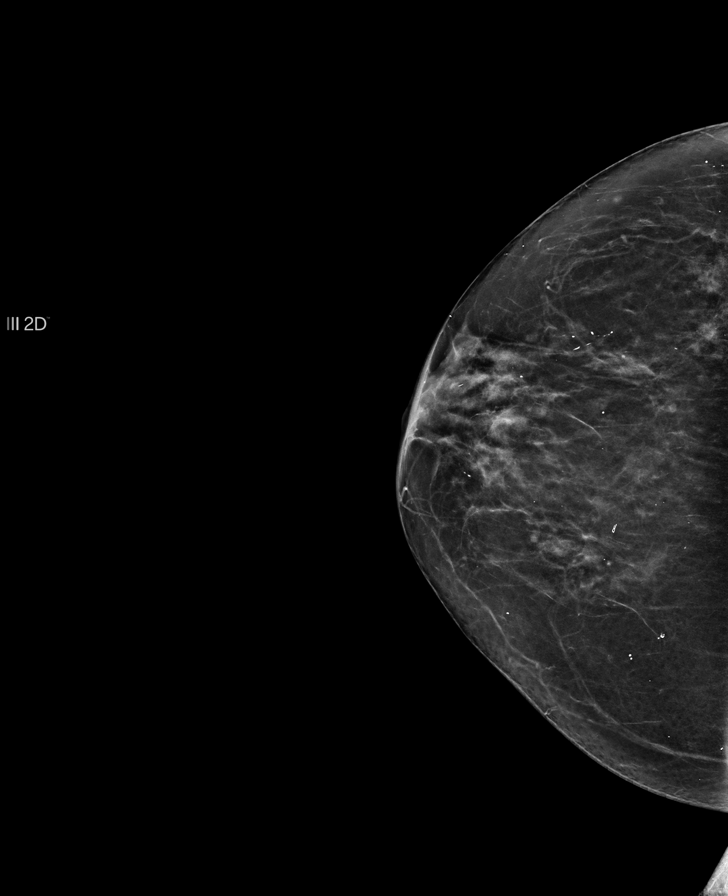

[L MLO]
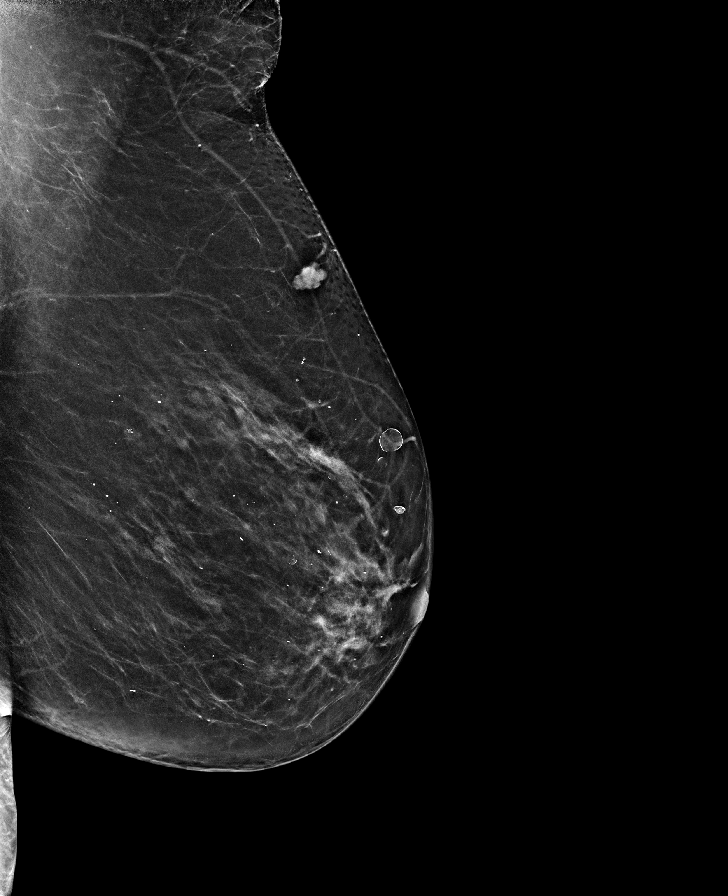

[L CC]
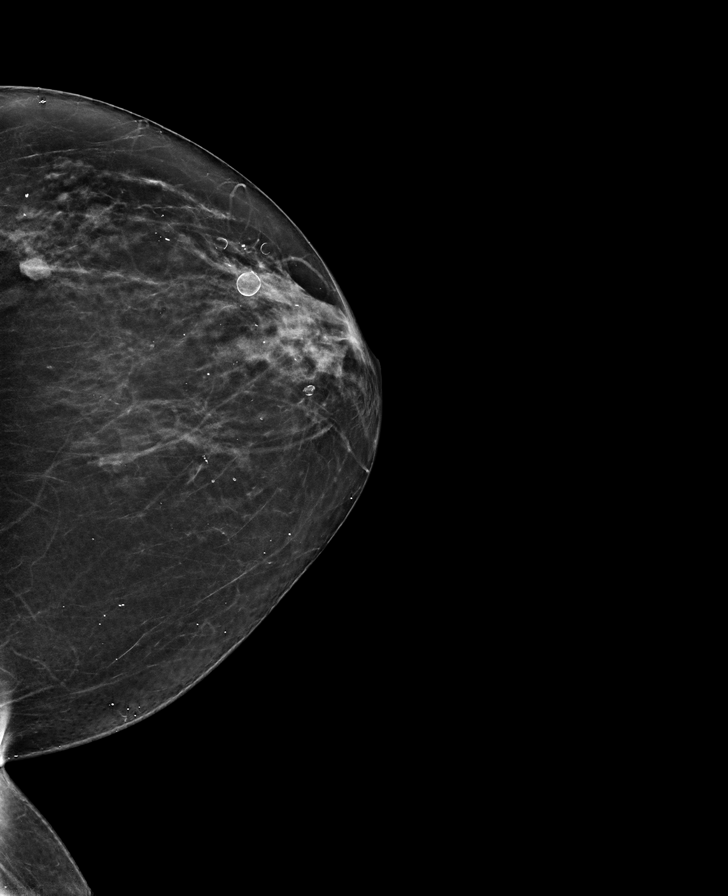

[R MLO]
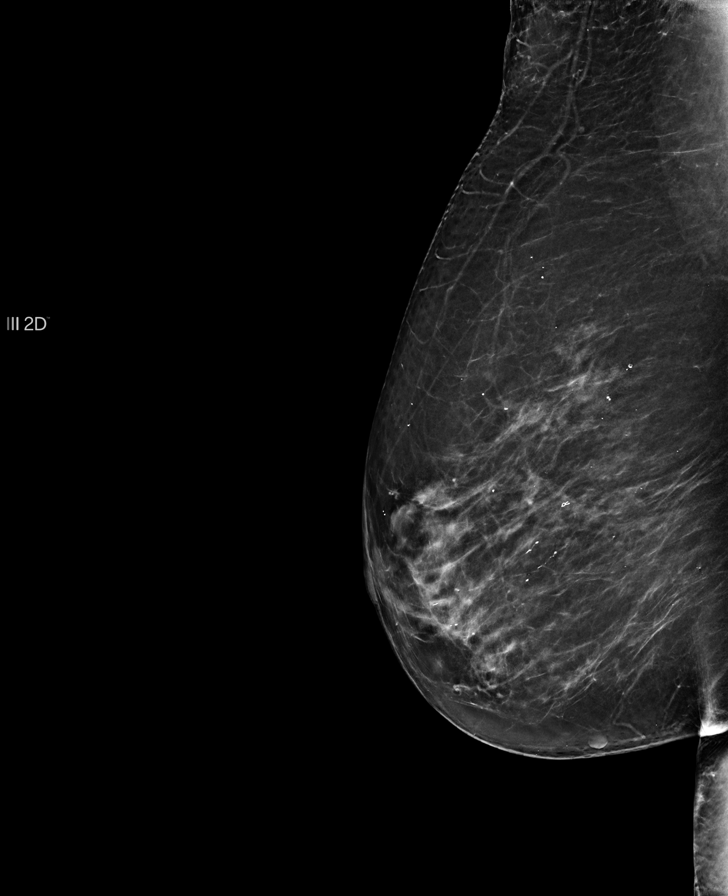

[L MLO tomo · tomo slice 12/23.0]
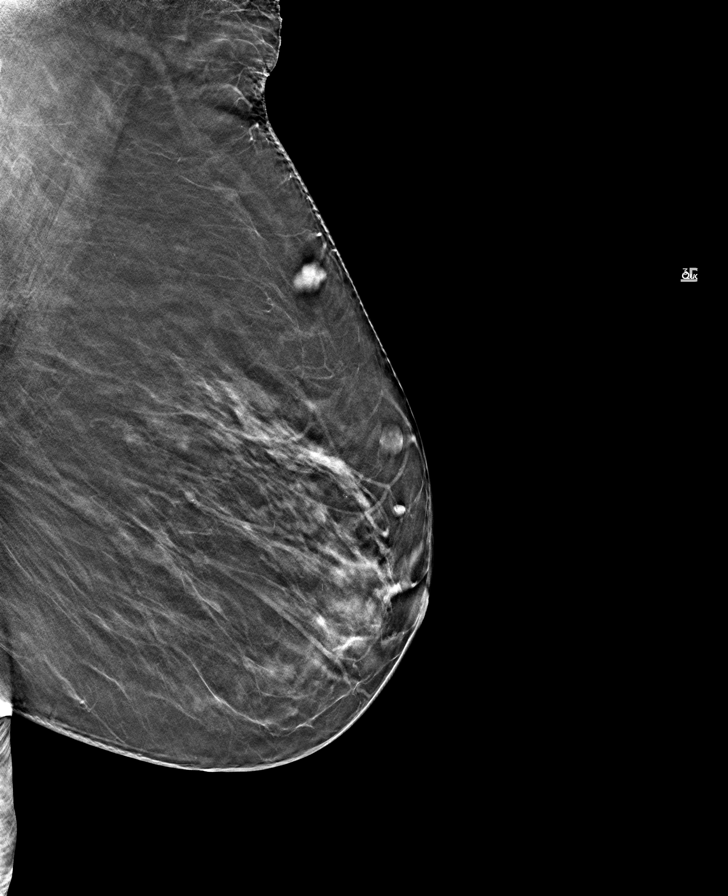

[R MLO tomo · tomo slice 12/23.0]
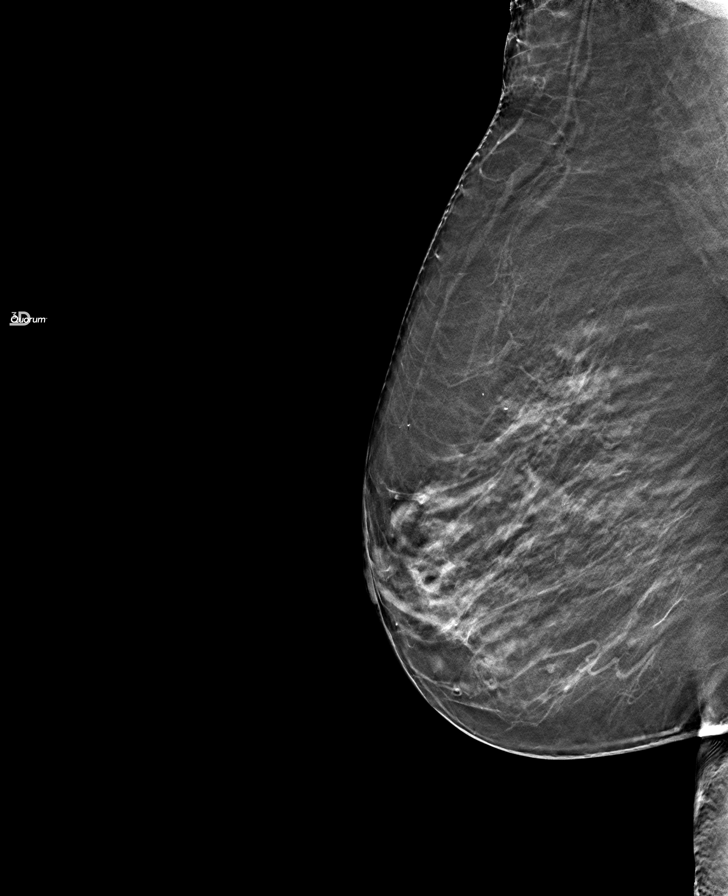

[L CC tomo · tomo slice 11/20.0]
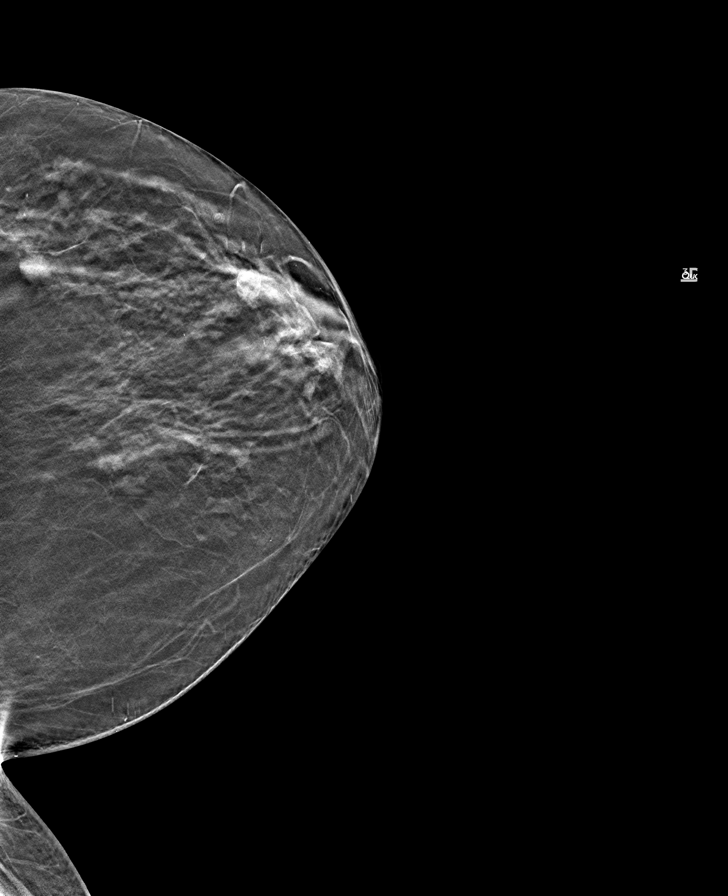

[R CC tomo · tomo slice 11/20.0]
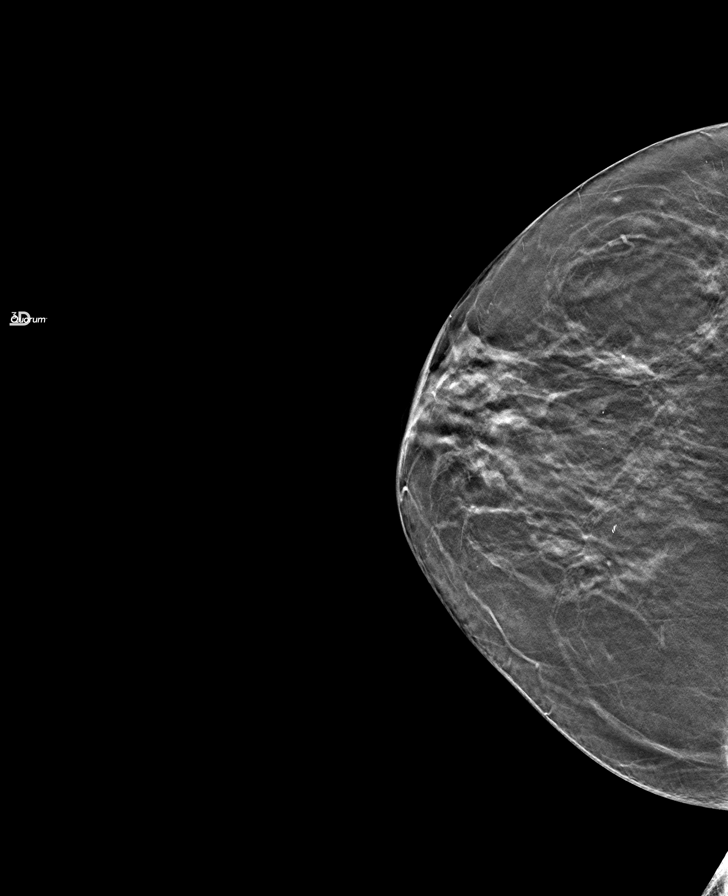

[8 of 24 positions shown; findings below may reference images not displayed]

FINDINGS: Scattered benign-appearing calculations in the breast. Nodular density 
at the [DATE] position of the left breast 9.5 cm the nipple will get ultrasound 
for further evaluation. Calcified oral cysts in in the upper-outer quadrant of 
the left breast approximately 4 cm from the nipple. No dominant mass in the 
right breast and no suspicious clusters of microcalcifications locations in 
either breast. 
 Patient is scheduled for bilateral complete breast ultrasound next week for 
further evaluation.
IMPRESSION: (BI-RADS 0) Incomplete. Need additional imaging.

## 2024-01-23 IMAGING — MG MAMMOGRAPHY DIAGNOSTIC POST CLIP UNILATERAL 3D TOMO
4 series · 4 of 12 positions shown · non-contrast
Comparison: Comparison was made to prior examinations.

________________________________________________________________________________________________ 
MAMMOGRAPHY DIAGNOSTIC POST CLIP UNILATERAL 3D TOMO, 01/23/2024 [DATE]: 
CLINICAL INDICATION: Post biopsy clip placement.
TECHNIQUE: Digital mammography and 3-D tomosynthesis of the left breast were 
obtained.

[L CC]
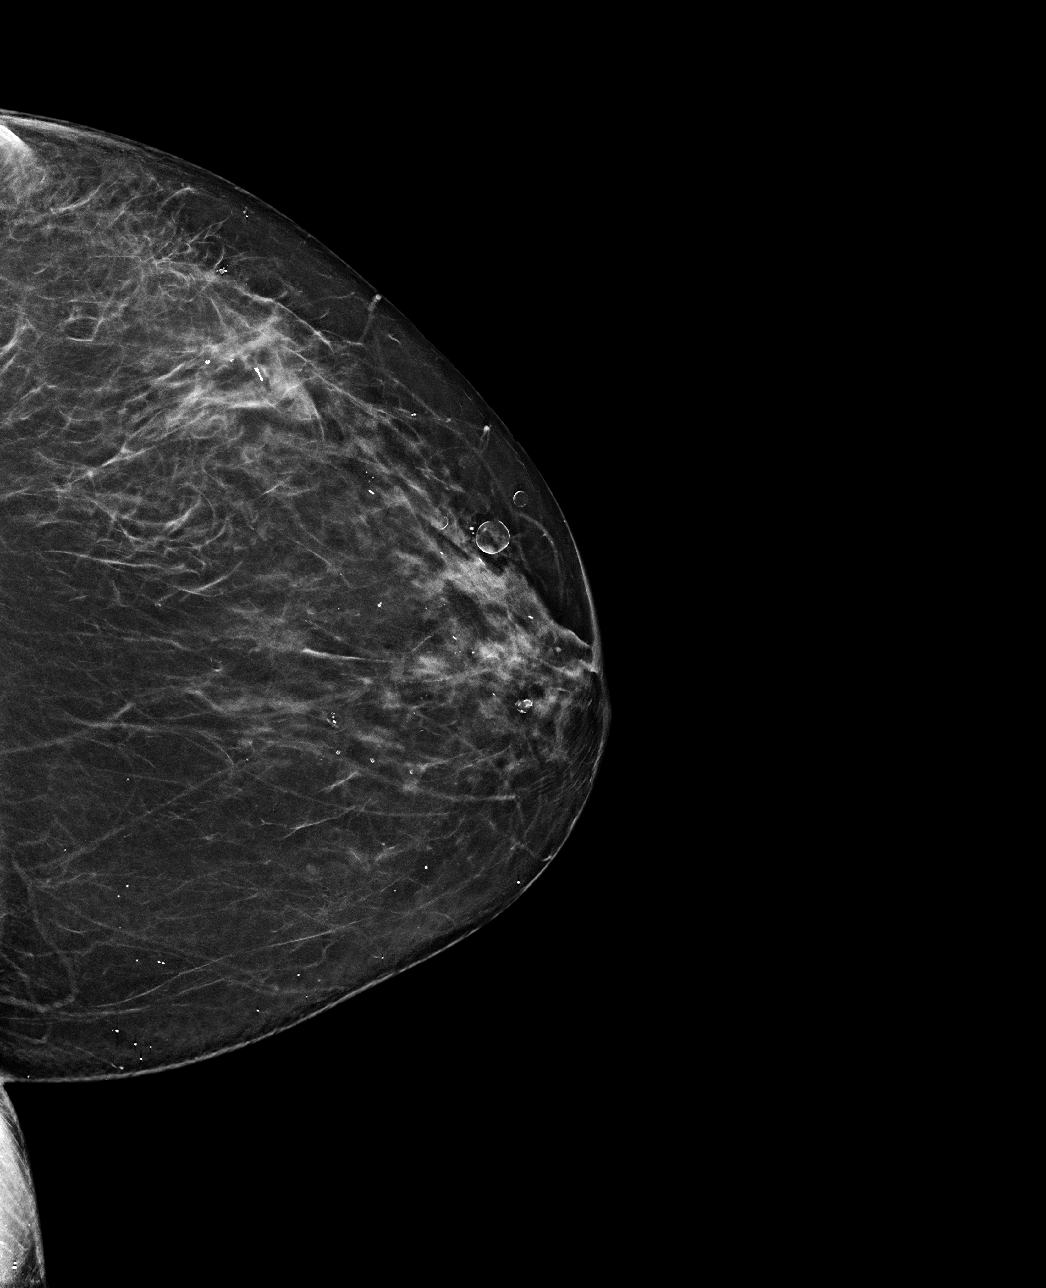

[L ML]
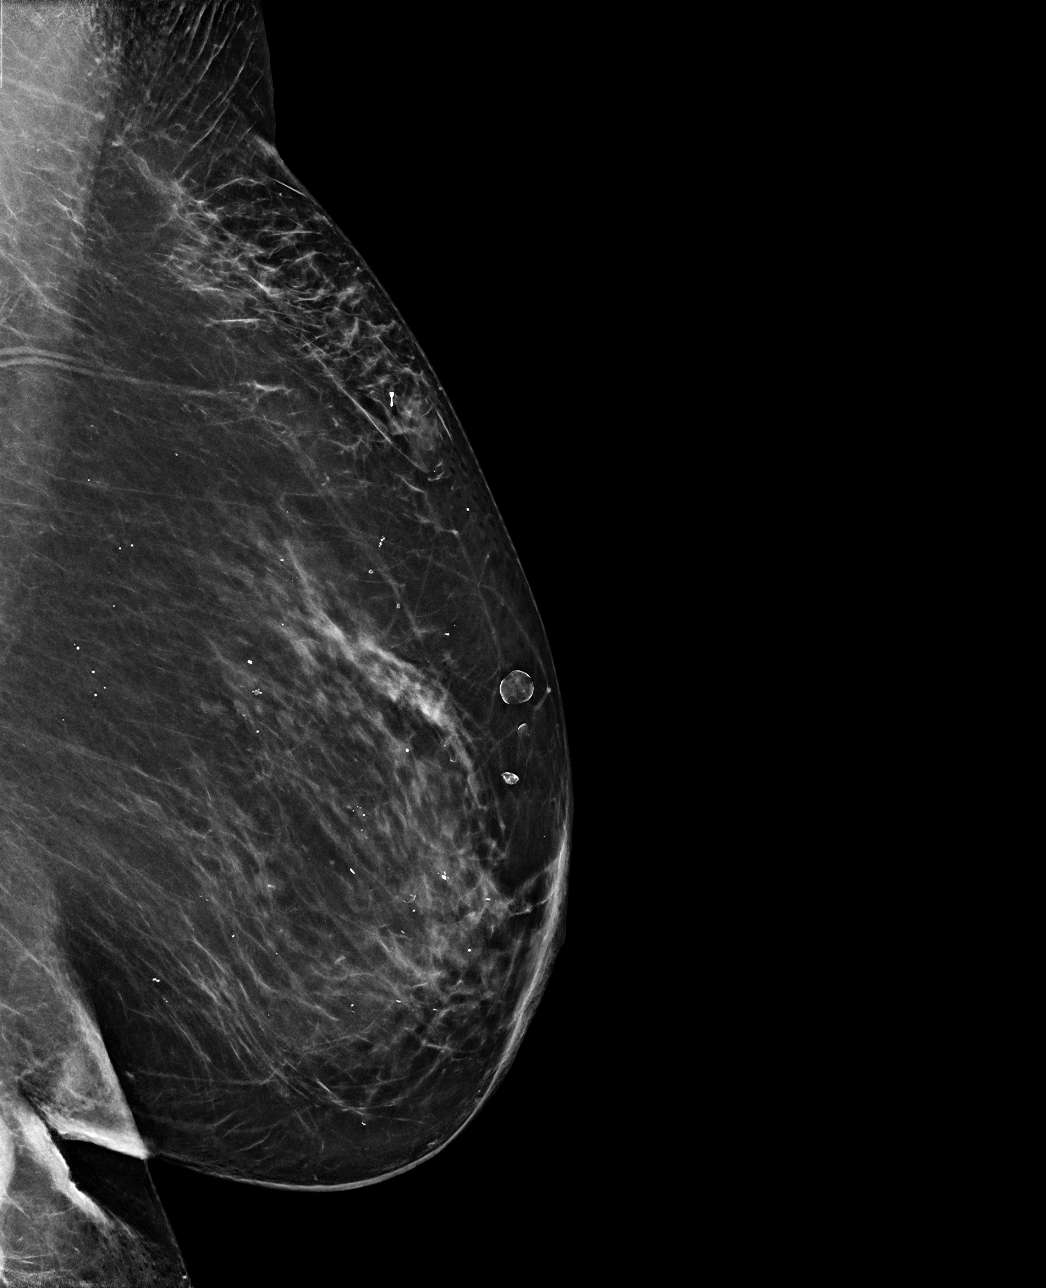

[L ML tomo · tomo slice 12/23.0]
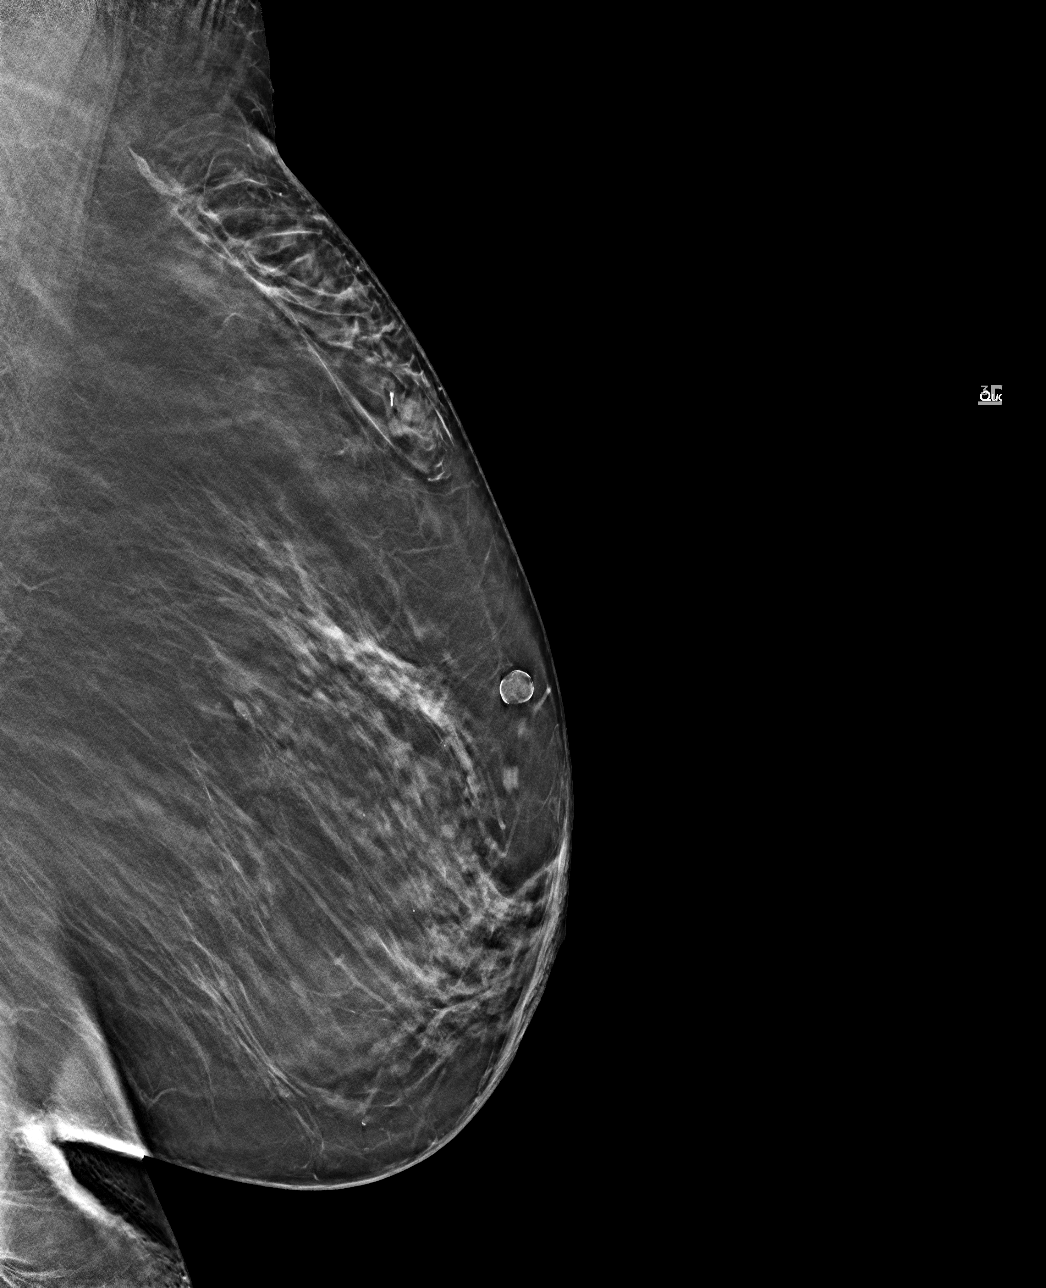

[L CC tomo · tomo slice 12/23.0]
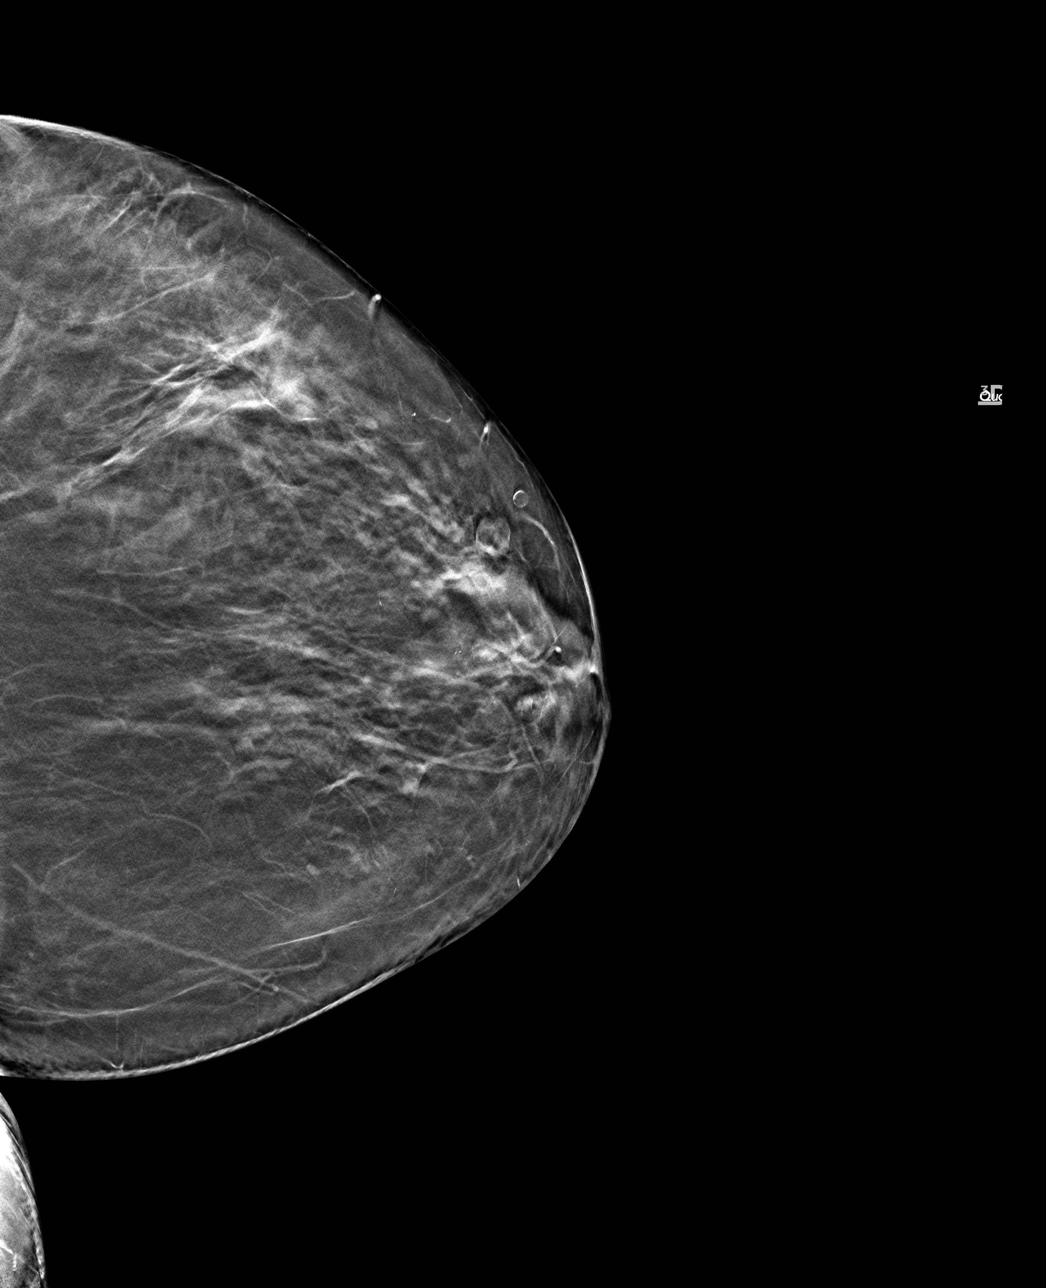

[4 of 12 positions shown; findings below may reference images not displayed]

FINDINGS: Post biopsy tissue marker is seen within the left breast.
IMPRESSION: Successful post biopsy tissue marker placement left breast.

## 2024-01-31 IMAGING — CT CT CHEST WITHOUT CONTRAST
2 of 4 series · 15 of 36 positions shown, 18 images · non-contrast
Comparison: CT 11/28/2023   
Count of known CT and Cardiac Nuclear Medicine studies performed in the previous 
12 months = 1.

________________________________________________________________________________________________ 
CT CHEST WITHOUT CONTRAST, 01/31/2024 [DATE]: 
CLINICAL INDICATION: Follow Up Nodules Right Lung 
A search for DICOM formatted images was conducted for prior CT imaging studies 
completed at a non-affiliated media free facility.
TECHNIQUE: The chest was scanned from base of neck through the lung bases 
without contrast on a high resolution low dose CT scanner. Routine MPR and MIP 
reconstruction images were performed.

[Series 2: axial · axial · 0.70mm/px · z∈[-296,-12]mm · 12 of 158 slices shown, 15 images]
[im 8/158  mediastinal]
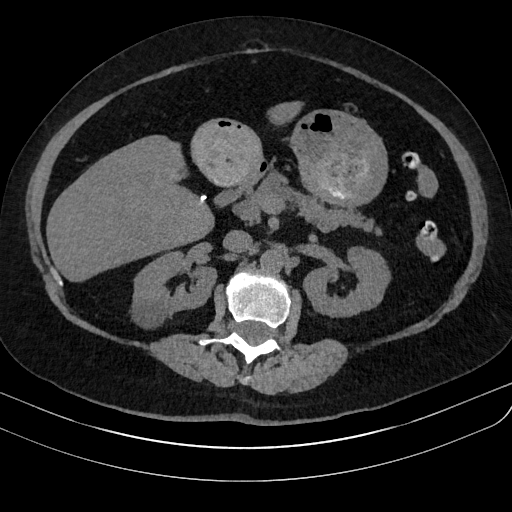
[im 8/158  lung]
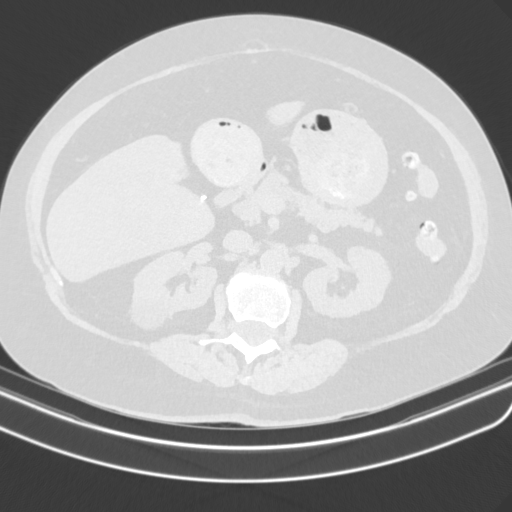
[im 23/158  lung]
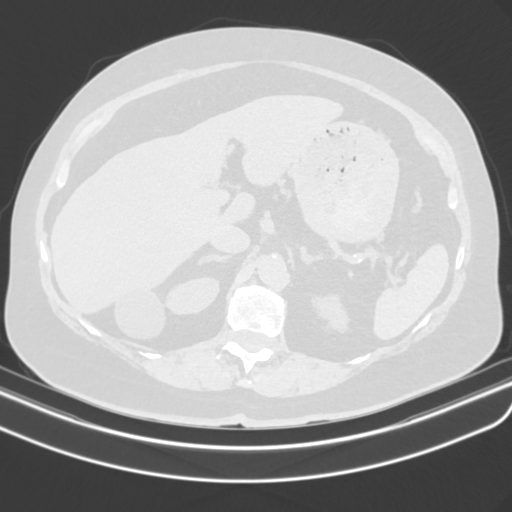
[im 38/158  lung]
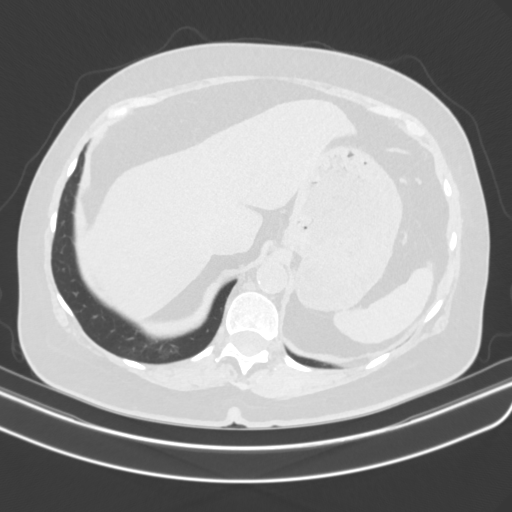
[im 45/158  lung]
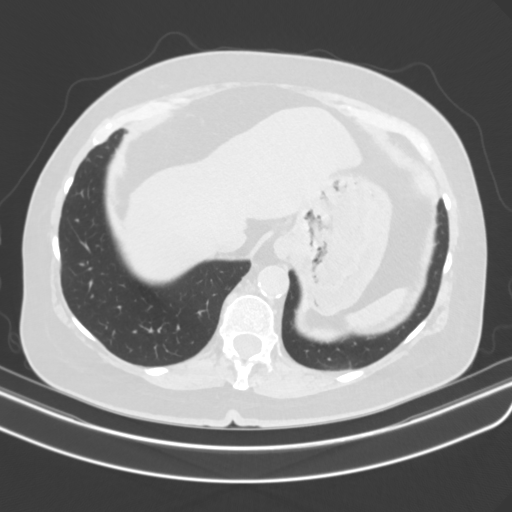
[im 60/158  mediastinal]
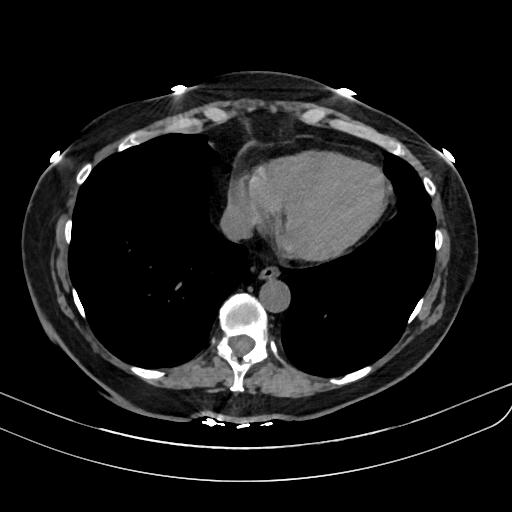
[im 60/158  lung]
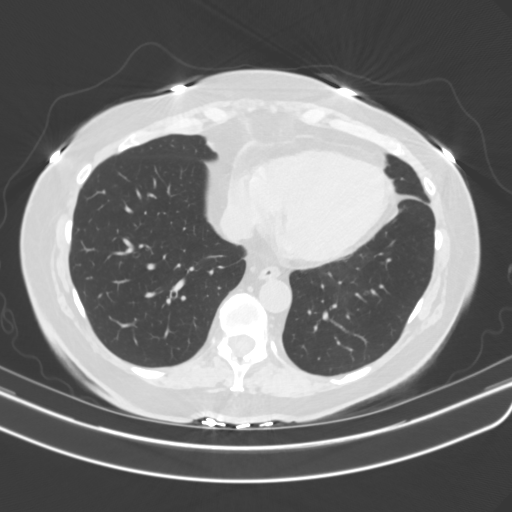
[im 75/158  lung]
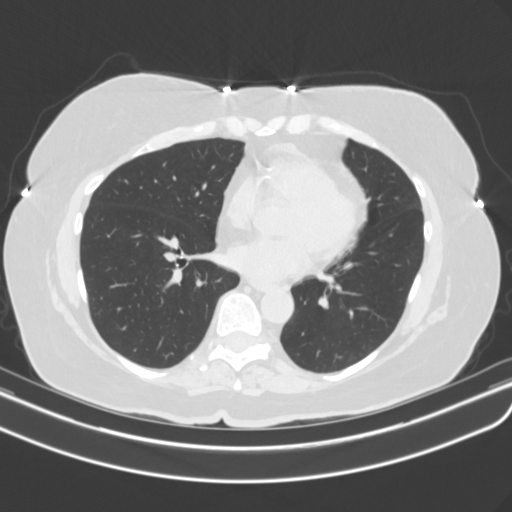
[im 83/158  lung]
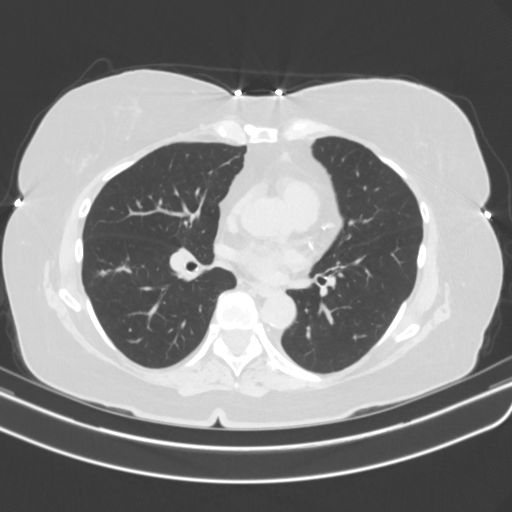
[im 98/158  lung]
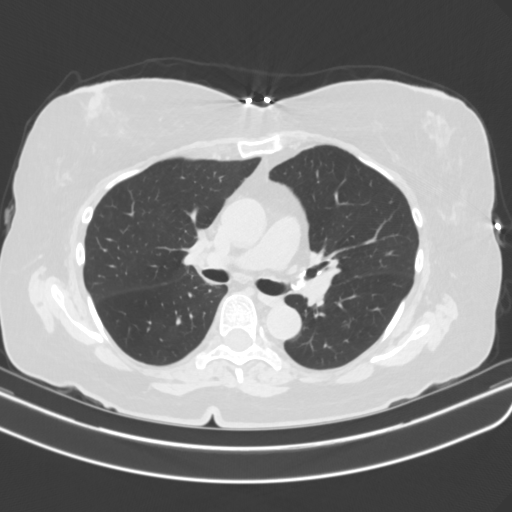
[im 113/158  mediastinal]
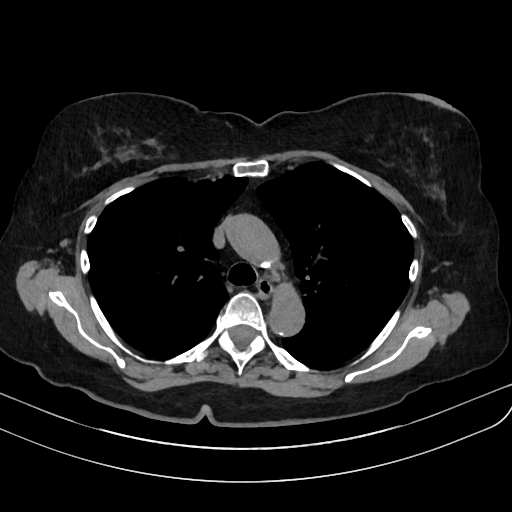
[im 113/158  lung]
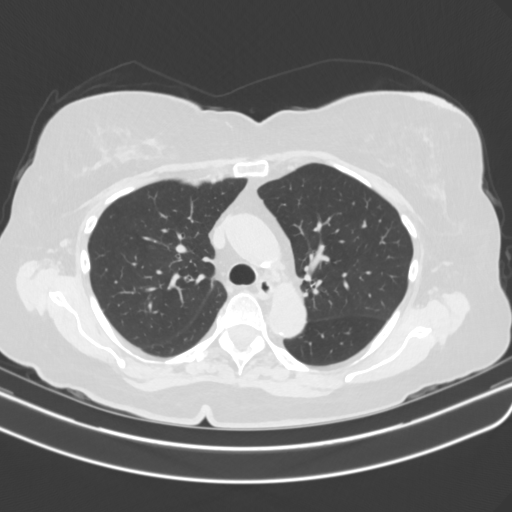
[im 120/158  lung]
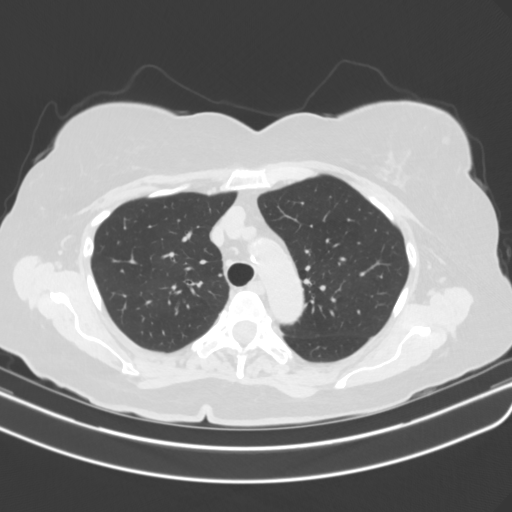
[im 135/158  lung]
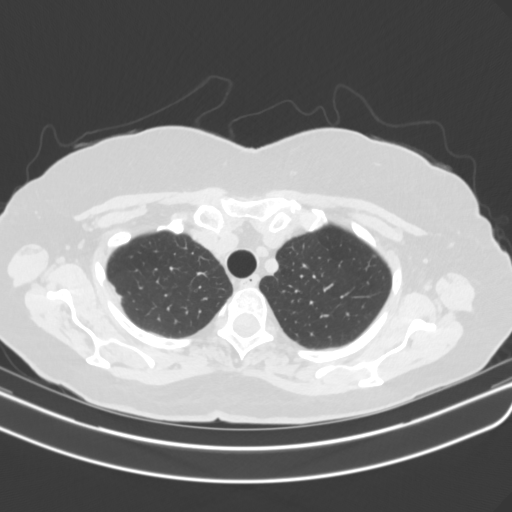
[im 150/158  lung]
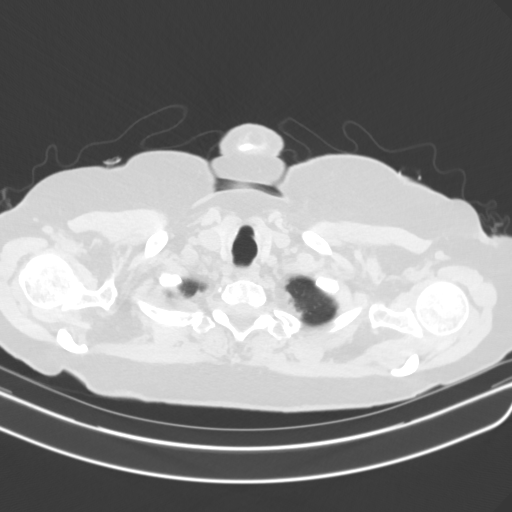

[Series 4: cor · coronal · 0.60mm/px · 3 of 110 slices shown]
[im 22/110  lung]
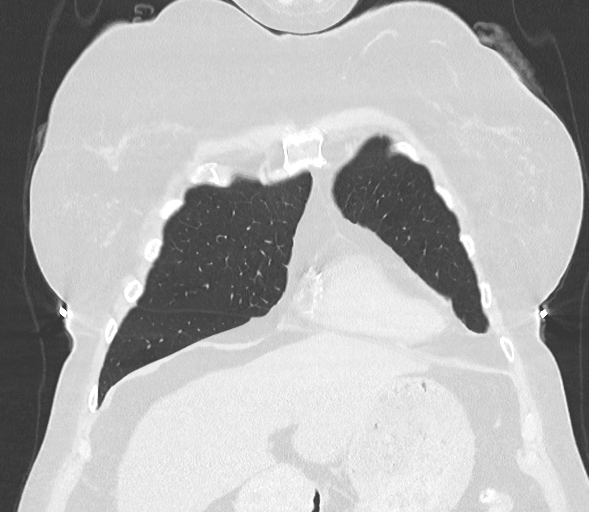
[im 44/110  lung]
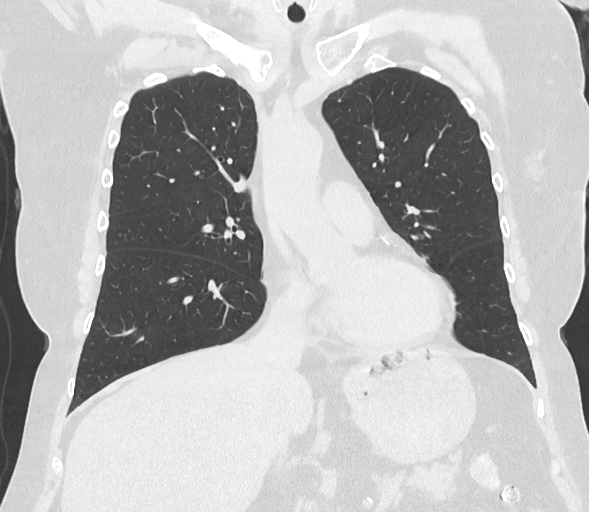
[im 66/110  lung]
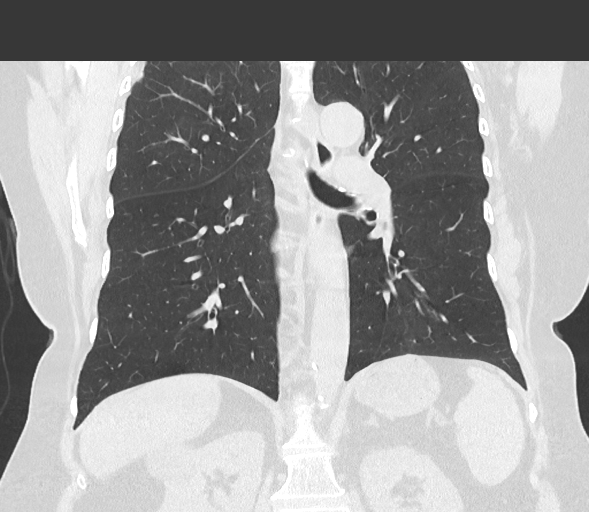

[15 of 36 positions shown; findings below may reference images not displayed]

FINDINGS: LUNGS AND PLEURA:  Grouped noncalcified nodules with ill-defined borders in the 
superior segment of the right upper lobe with the largest measuring 9 mm. No new 
suspicious pulmonary nodules. No acute consolidation or effusion.  
MEDIASTINUM:  No adenopathy. Normal heart size. No pericardial effusion. Mild 
coronary artery calcifications noted. 
CHEST WALL/AXILLA: No adenopathy. Well-circumscribed nodular density in the left 
breast appears stable. Heart mediastinum are unchanged. Moderate coronary artery 
calcification. 
UPPER ABDOMEN: 9 mm splenic artery aneurysm. Right renal cyst. Status post 
cholecystectomy. 
MUSCULOSKELETAL: No acute abnormality. Schmorls nodes at the superior plate of 
T6 and T11.
IMPRESSION: Stable loosely grouped noncalcified nodules right upper lobe with the largest 
measuring 9 mm. No new suspicious pulmonary nodules. 
No mediastinal or hilar adenopathy. 
Stable splenic artery aneurysm. 
In patients between the ages of 50-77 where pulmonary emphysema is noted on CT, 
recommend evaluation for low dose lung cancer screening protocol if patient is 
not already enrolled; as pulmonary emphysema is an independent risk factor for 
lung cancer. 
RADIATION DOSE REDUCTION: All CT scans are performed using radiation dose 
reduction techniques, when applicable.  Technical factors are evaluated and 
adjusted to ensure appropriate moderation of exposure.  Automated dose 
management technology is applied to adjust the radiation doses to minimize 
exposure while achieving diagnostic quality images.

## 2024-07-14 ENCOUNTER — Other Ambulatory Visit
Admission: RE | Admit: 2024-07-14 | Discharge: 2024-07-14 | Disposition: A | Discharge status: Home / Self Care | Source: Ambulatory Visit | Attending: Dermatopathology | Admitting: Dermatopathology

## 2024-07-14 DIAGNOSIS — C44529 Squamous cell carcinoma of skin of other part of trunk: Secondary | ICD-10-CM

## 2024-07-14 DIAGNOSIS — C44622 Squamous cell carcinoma of skin of right upper limb, including shoulder: Secondary | ICD-10-CM | POA: Insufficient documentation

## 2024-07-23 LAB — SURGICAL PATHOLOGY

## 2024-08-25 ENCOUNTER — Other Ambulatory Visit
Admission: RE | Admit: 2024-08-25 | Discharge: 2024-08-25 | Disposition: A | Discharge status: Home / Self Care | Source: Ambulatory Visit | Attending: Dermatopathology | Admitting: Dermatopathology

## 2024-08-25 DIAGNOSIS — C44529 Squamous cell carcinoma of skin of other part of trunk: Secondary | ICD-10-CM | POA: Insufficient documentation

## 2024-08-29 LAB — SURGICAL PATHOLOGY
